# Patient Record
Sex: Female | Born: 1980 | Race: Black or African American | Hispanic: No
Health system: Southern US, Community
[De-identification: ages and names within clinical notes are randomized; demographics above are authoritative.]

## PROBLEM LIST (undated history)

## (undated) DIAGNOSIS — J45909 Unspecified asthma, uncomplicated: Secondary | ICD-10-CM

## (undated) DIAGNOSIS — Z98891 History of uterine scar from previous surgery: Secondary | ICD-10-CM

## (undated) DIAGNOSIS — R519 Headache, unspecified: Secondary | ICD-10-CM

## (undated) HISTORY — PX: TUBAL LIGATION: SHX77

---

## 2017-02-27 DIAGNOSIS — D259 Leiomyoma of uterus, unspecified: Secondary | ICD-10-CM | POA: Insufficient documentation

## 2017-02-27 DIAGNOSIS — K529 Noninfective gastroenteritis and colitis, unspecified: Secondary | ICD-10-CM | POA: Insufficient documentation

## 2017-02-27 DIAGNOSIS — F17200 Nicotine dependence, unspecified, uncomplicated: Secondary | ICD-10-CM | POA: Insufficient documentation

## 2017-02-27 LAB — CBC
HEMATOCRIT: 38.8 % (ref 35.0–47.0)
HEMOGLOBIN: 13.1 g/dL (ref 12.0–16.0)
MCH: 30.5 pg (ref 26.0–34.0)
MCHC: 33.8 g/dL (ref 32.0–36.0)
MCV: 90.4 fL (ref 80.0–100.0)
Platelets: 199 10*3/uL (ref 150–440)
RBC: 4.3 MIL/uL (ref 3.80–5.20)
RDW: 13.6 % (ref 11.5–14.5)
WBC: 7.7 10*3/uL (ref 3.6–11.0)

## 2017-02-27 LAB — COMPREHENSIVE METABOLIC PANEL
ALT: 16 U/L (ref 14–54)
ANION GAP: 10 (ref 5–15)
AST: 24 U/L (ref 15–41)
Albumin: 4.5 g/dL (ref 3.5–5.0)
Alkaline Phosphatase: 55 U/L (ref 38–126)
BILIRUBIN TOTAL: 0.9 mg/dL (ref 0.3–1.2)
BUN: 11 mg/dL (ref 6–20)
CHLORIDE: 106 mmol/L (ref 101–111)
CO2: 23 mmol/L (ref 22–32)
Calcium: 8.9 mg/dL (ref 8.9–10.3)
Creatinine, Ser: 0.7 mg/dL (ref 0.44–1.00)
GLUCOSE: 110 mg/dL — AB (ref 65–99)
POTASSIUM: 3.6 mmol/L (ref 3.5–5.1)
Sodium: 139 mmol/L (ref 135–145)
TOTAL PROTEIN: 7.6 g/dL (ref 6.5–8.1)

## 2017-02-27 LAB — LIPASE, BLOOD: Lipase: 18 U/L (ref 11–51)

## 2017-02-27 MED ORDER — ONDANSETRON 4 MG PO TBDP
4.0000 mg | ORAL_TABLET | Freq: Once | ORAL | Status: AC
  Administered 2017-02-27: 4 mg via ORAL

## 2017-02-27 MED ORDER — ONDANSETRON 4 MG PO TBDP
ORAL_TABLET | ORAL | Status: AC
  Filled 2017-02-27: qty 1

## 2017-02-27 NOTE — ED Triage Notes (Addendum)
Pt presents via [facility] with c/o of RLQ pain that started today. Pt states N/V. Pt currently vomiting in triage.

## 2017-02-27 NOTE — ED Notes (Signed)
Pt given specimen cup to obtain urine when able to.

## 2017-02-28 ENCOUNTER — Emergency Department
Admission: EM | Admit: 2017-02-28 | Discharge: 2017-02-28 | Disposition: A | Discharge status: Home / Self Care | Payer: Self-pay | Attending: Emergency Medicine | Admitting: Emergency Medicine

## 2017-02-28 ENCOUNTER — Emergency Department: Payer: Self-pay

## 2017-02-28 DIAGNOSIS — D259 Leiomyoma of uterus, unspecified: Secondary | ICD-10-CM

## 2017-02-28 DIAGNOSIS — R102 Pelvic and perineal pain: Secondary | ICD-10-CM

## 2017-02-28 DIAGNOSIS — K529 Noninfective gastroenteritis and colitis, unspecified: Secondary | ICD-10-CM

## 2017-02-28 HISTORY — DX: Unspecified asthma, uncomplicated: J45.909

## 2017-02-28 LAB — URINALYSIS, COMPLETE (UACMP) WITH MICROSCOPIC
BILIRUBIN URINE: NEGATIVE
GLUCOSE, UA: NEGATIVE mg/dL
KETONES UR: 20 mg/dL — AB
NITRITE: NEGATIVE
PH: 5 (ref 5.0–8.0)
PROTEIN: 30 mg/dL — AB
Specific Gravity, Urine: 1.031 — ABNORMAL HIGH (ref 1.005–1.030)

## 2017-02-28 LAB — POCT PREGNANCY, URINE: Preg Test, Ur: NEGATIVE

## 2017-02-28 MED ORDER — IOPAMIDOL (ISOVUE-300) INJECTION 61%
30.0000 mL | Freq: Once | INTRAVENOUS | Status: AC | PRN
  Administered 2017-02-28: 30 mL via ORAL

## 2017-02-28 MED ORDER — KETOROLAC TROMETHAMINE 30 MG/ML IJ SOLN
30.0000 mg | Freq: Once | INTRAMUSCULAR | Status: AC
Start: 2017-02-28 — End: 2017-02-28
  Administered 2017-02-28: 30 mg via INTRAVENOUS
  Filled 2017-02-28: qty 1

## 2017-02-28 MED ORDER — IOPAMIDOL (ISOVUE-300) INJECTION 61%
100.0000 mL | Freq: Once | INTRAVENOUS | Status: AC | PRN
  Administered 2017-02-28: 100 mL via INTRAVENOUS

## 2017-02-28 MED ORDER — ONDANSETRON HCL 4 MG/2ML IJ SOLN
4.0000 mg | Freq: Once | INTRAMUSCULAR | Status: AC
  Administered 2017-02-28: 4 mg via INTRAVENOUS
  Filled 2017-02-28: qty 2

## 2017-02-28 MED ORDER — ONDANSETRON 4 MG PO TBDP: 4.0000 mg | ORAL_TABLET | Freq: Three times a day (TID) | ORAL | 0 refills | Status: DC | PRN

## 2017-02-28 NOTE — ED Provider Notes (Signed)
[facility] Emergency Department Provider Note    First MD Initiated Contact with Patient 02/28/17 0120     (approximate)  I have reviewed the triage vital signs and the nursing notes.   HISTORY  Chief Complaint Abdominal Pain   HPI Jamie Bishop is a 36 y.o. female presents with acute onset of left lower quadrant abdominal pain yesterday evening after work. Patient states her current pain score is 10 out of 10 and described as sharp. Patient admits to vomiting no diarrhea. Patient denies any fever. Patient denies any urinary symptoms no dysuria or frequency urgency or hematuria.  Past medical history None There are no active problems to display for this patient.   Past surgical history None  Prior to Admission medications   Medication Sig Start Date End Date Taking? Authorizing Provider  ondansetron (ZOFRAN ODT) 4 MG disintegrating tablet Take 1 tablet (4 mg total) by mouth every 8 (eight) hours as needed for nausea or vomiting. 02/28/17   Gregor Hams, MD    Allergies No known drug allergies  Family history Noncontributory  Social History Social History  Substance Use Topics  . Smoking status: Current Every Day Smoker  . Smokeless tobacco: Never Used  . Alcohol use Not on file    Review of Systems Constitutional: No fever/chills Eyes: No visual changes. ENT: No sore throat. Cardiovascular: Denies chest pain. Respiratory: Denies shortness of breath. Gastrointestinal: Positive for abdominal pain and vomiting. Genitourinary: Negative for dysuria. Musculoskeletal: Negative for neck pain.  Negative for back pain. Integumentary: Negative for rash. Neurological: Negative for headaches, focal weakness or numbness.   ____________________________________________   PHYSICAL EXAM:  VITAL SIGNS: ED Triage Vitals  Enc Vitals Group     BP 02/27/17 2216 134/80     Pulse Rate 02/27/17 2216 67     Resp 02/27/17 2216 18     Temp  02/27/17 2216 97.8 F (36.6 C)     Temp Source 02/27/17 2216 Oral     SpO2 02/27/17 2216 100 %     Weight 02/27/17 2213 150 lb (68 kg)     Height 02/27/17 2213 4\' 11"  (1.499 m)     Head Circumference --      Peak Flow --      Pain Score 02/27/17 2212 10     Pain Loc --      Pain Edu? --      Excl. in [place]? --     Constitutional: Alert and oriented. Well appearing and in no acute distress. Eyes: Conjunctivae are normal.  Head: Atraumatic. Mouth/Throat: Mucous membranes are moist. Oropharynx non-erythematous. Neck: No stridor.   Cardiovascular: Normal rate, regular rhythm. Good peripheral circulation. Grossly normal heart sounds. Respiratory: Normal respiratory effort.  No retractions. Lungs CTAB. Gastrointestinal: Soft and nontender. No distention.  Musculoskeletal: No lower extremity tenderness nor edema. No gross deformities of extremities. Neurologic:  Normal speech and language. No gross focal neurologic deficits are appreciated.  Skin:  Skin is warm, dry and intact. No rash noted. Psychiatric: Mood and affect are normal. Speech and behavior are normal.  ____________________________________________   LABS (all labs ordered are listed, but only abnormal results are displayed)  Labs Reviewed  COMPREHENSIVE METABOLIC PANEL - Abnormal; Notable for the following:       Result Value   Glucose, Bld 110 (*)    All other components within normal limits  URINALYSIS, COMPLETE (UACMP) WITH MICROSCOPIC - Abnormal; Notable for the following:    Color, Urine AMBER (*)  APPearance CLOUDY (*)    Specific Gravity, Urine 1.031 (*)    Hgb urine dipstick SMALL (*)    Ketones, ur 20 (*)    Protein, ur 30 (*)    Leukocytes, UA MODERATE (*)    Bacteria, UA RARE (*)    Squamous Epithelial / LPF 6-30 (*)    All other components within normal limits  LIPASE, BLOOD  CBC  POCT PREGNANCY, URINE    RADIOLOGY I, [place], personally viewed and evaluated these images (plain  radiographs) as part of my medical decision making, as well as reviewing the written report by the radiologist.  US Transvaginal Non-ob  Result Date: 02/28/2017 CLINICAL DATA:  36 year old G2 P2 female with LMP 02/11/2017 presenting with right lower quadrant pain and abnormal appearance of the uterus on CT. EXAM: TRANSABDOMINAL AND TRANSVAGINAL ULTRASOUND OF PELVIS TECHNIQUE: Both transabdominal and transvaginal ultrasound examinations of the pelvis were performed. Transabdominal technique was performed for global imaging of the pelvis including uterus, ovaries, adnexal regions, and pelvic cul-de-sac. It was necessary to proceed with endovaginal exam following the transabdominal exam to visualize the uterus, endometrium and ovaries. COMPARISON:  CT abdomen pelvis 02/28/2017 FINDINGS: Uterus Measurements: 8.1 x 5.3 x 5.8 cm. There is a large intramural mass exerting mass effect on the endometrial cavity. The mass measures 4.4 x 3.5 x 3.7 cm and is located at the left aspect of the fundus. There is an internal fluid level. There is no internal vascularity. Endometrium Thickness: 5.6 mm.  No focal abnormality visualized. Right ovary Measurements: 3.2 x 1.7 x 2.2 cm. Normal appearance/no adnexal mass. Left ovary Measurements: 3.0 x 1.4 x 2.2 cm. Normal appearance/no adnexal mass. Other findings No abnormal free fluid. IMPRESSION: 1. Large intramural mass distorting the adjacent endometrial cavity with internal fluid level is most suggestive of a degenerating uterine fibroid. 2. No ovarian or adnexal abnormality. Electronically Signed   By: Ulyses Jarred M.D.   On: 02/28/2017 05:25   US Pelvis Complete  Result Date: 02/28/2017 CLINICAL DATA:  36 year old G59 P2 female with LMP 02/11/2017 presenting with right lower quadrant pain and abnormal appearance of the uterus on CT. EXAM: TRANSABDOMINAL AND TRANSVAGINAL ULTRASOUND OF PELVIS TECHNIQUE: Both transabdominal and transvaginal ultrasound examinations of the  pelvis were performed. Transabdominal technique was performed for global imaging of the pelvis including uterus, ovaries, adnexal regions, and pelvic cul-de-sac. It was necessary to proceed with endovaginal exam following the transabdominal exam to visualize the uterus, endometrium and ovaries. COMPARISON:  CT abdomen pelvis 02/28/2017 FINDINGS: Uterus Measurements: 8.1 x 5.3 x 5.8 cm. There is a large intramural mass exerting mass effect on the endometrial cavity. The mass measures 4.4 x 3.5 x 3.7 cm and is located at the left aspect of the fundus. There is an internal fluid level. There is no internal vascularity. Endometrium Thickness: 5.6 mm.  No focal abnormality visualized. Right ovary Measurements: 3.2 x 1.7 x 2.2 cm. Normal appearance/no adnexal mass. Left ovary Measurements: 3.0 x 1.4 x 2.2 cm. Normal appearance/no adnexal mass. Other findings No abnormal free fluid. IMPRESSION: 1. Large intramural mass distorting the adjacent endometrial cavity with internal fluid level is most suggestive of a degenerating uterine fibroid. 2. No ovarian or adnexal abnormality. Electronically Signed   By: Ulyses Jarred M.D.   On: 02/28/2017 05:25   Ct Abdomen Pelvis W Contrast  Result Date: 02/28/2017 CLINICAL DATA:  Right lower quadrant pain starting today with nausea and vomiting. History of partial hysterectomy. Negative pregnancy test. EXAM:  CT ABDOMEN AND PELVIS WITH CONTRAST TECHNIQUE: Multidetector CT imaging of the abdomen and pelvis was performed using the standard protocol following bolus administration of intravenous contrast. CONTRAST:  1107mL ISOVUE-300 IOPAMIDOL (ISOVUE-300) INJECTION 61% COMPARISON:  None. FINDINGS: Lower chest: Normal size cardiac chambers.  Clear bases. Hepatobiliary: There is a small ventral hernia overlying the left hepatic lobe possibly containing a small portion of the umbilical vein, series 2, image 26. The liver enhances homogeneously without space-occupying mass. The gallbladder  is contracted. There may be a tiny calculus along the dependent wall, series 2, image 34. No secondary signs of acute cholecystitis. Pancreas: Unremarkable. No pancreatic ductal dilatation or surrounding inflammatory changes. Spleen: Normal in size without focal abnormality. Adrenals/Urinary Tract: Adrenal glands are unremarkable. Kidneys are normal, without renal calculi, focal lesion, or hydronephrosis. Bladder is unremarkable. Stomach/Bowel: The stomach is nondistended. There is normal small bowel rotation. Mild fluid-filled distention and thickening of the duodenum and proximal jejunum may represent an enteritis. No definite evidence of appendicitis. There is no large bowel dilatation. Somewhat thickened appearance of the rectosigmoid may be due to underdistention. A mild proctocolitis is not excluded but believed less likely. Vascular/Lymphatic: No significant vascular findings are present. No enlarged abdominal or pelvic lymph nodes. Reproductive: There is an approximately 3.9 x 3.3 x 3.6 cm well-circumscribed intramural hypodensity associated with the uterus possibly representing a degenerating fibroid. It appears to be adjacent to the endometrial cavity which is to the right of midline. Normal physiologic sized follicles are noted of the left ovary. No adnexal mass is seen. Other: Small fat containing umbilical hernia. No abdominopelvic ascites. Musculoskeletal: No acute or significant osseous findings. IMPRESSION: 1. There is a well circumscribed intramural water attenuating hypodensity associated with the uterus measuring 3.9 x 3.3 x 3.6 cm possibly representing a degenerating uterine fibroid. It appears to be separate from the endometrial cavity which is seen to the right of this abnormality. Ultrasound or pelvic MRI may help for further correlation. Has this patient undergone uterine fibroid embolization that might explain this finding? 2. Mild thickening of proximal small bowel question enteritis.  Slight segmental thickening of the rectosigmoid may represent underdistention. The possibility of a proctocolitis is not entirely excluded but believed less likely in the absence of clinical signs. 3. Umbilical and upper abdominal ventral hernias as above. Electronically Signed   By: Ashley Royalty M.D.   On: 02/28/2017 03:05      Procedures   ____________________________________________   INITIAL IMPRESSION / ASSESSMENT AND PLAN / ED COURSE  Pertinent labs & imaging results that were available during my care of the patient were reviewed by me and considered in my medical decision making (see chart for details).  Patient given Toradol and Zofran in the emergency department with no further vomiting. Abdominal pain improved.      ____________________________________________  FINAL CLINICAL IMPRESSION(S) / ED DIAGNOSES  Final diagnoses:  Enteritis  Uterine leiomyoma, unspecified location     MEDICATIONS GIVEN DURING THIS VISIT:  Medications  ondansetron (ZOFRAN-ODT) 4 MG disintegrating tablet (not administered)  ondansetron (ZOFRAN-ODT) disintegrating tablet 4 mg (4 mg Oral Given 02/27/17 2219)  ketorolac (TORADOL) 30 MG/ML injection 30 mg (30 mg Intravenous Given 02/28/17 0138)  ondansetron (ZOFRAN) injection 4 mg (4 mg Intravenous Given 02/28/17 0138)  iopamidol (ISOVUE-300) 61 % injection 30 mL (30 mLs Oral Contrast Given 02/28/17 0136)  iopamidol (ISOVUE-300) 61 % injection 100 mL (100 mLs Intravenous Contrast Given 02/28/17 0226)     NEW OUTPATIENT MEDICATIONS STARTED DURING THIS  VISIT:  Discharge Medication List as of 02/28/2017  6:12 AM    START taking these medications   Details  ondansetron (ZOFRAN ODT) 4 MG disintegrating tablet Take 1 tablet (4 mg total) by mouth every 8 (eight) hours as needed for nausea or vomiting., Starting Thu 02/28/2017, Print        Discharge Medication List as of 02/28/2017  6:12 AM      Discharge Medication List as of 02/28/2017  6:12 AM        Note:  This document was prepared using Dragon voice recognition software and may include unintentional dictation errors.    Gregor Hams, MD 02/28/17 [phone]

## 2017-02-28 NOTE — ED Notes (Signed)
Patient transported to Ultrasound 

## 2017-09-02 LAB — HM HIV SCREENING LAB: HM HIV Screening: NEGATIVE

## 2017-11-19 ENCOUNTER — Emergency Department
Admission: EM | Admit: 2017-11-19 | Discharge: 2017-11-19 | Disposition: A | Discharge status: Home / Self Care | Payer: Self-pay | Attending: Emergency Medicine | Admitting: Emergency Medicine

## 2017-11-19 ENCOUNTER — Other Ambulatory Visit: Payer: Self-pay

## 2017-11-19 DIAGNOSIS — J45909 Unspecified asthma, uncomplicated: Secondary | ICD-10-CM | POA: Insufficient documentation

## 2017-11-19 DIAGNOSIS — H1032 Unspecified acute conjunctivitis, left eye: Secondary | ICD-10-CM | POA: Insufficient documentation

## 2017-11-19 DIAGNOSIS — F17200 Nicotine dependence, unspecified, uncomplicated: Secondary | ICD-10-CM | POA: Insufficient documentation

## 2017-11-19 MED ORDER — SULFACETAMIDE SODIUM 10 % OP SOLN: 2.0000 [drp] | OPHTHALMIC | 0 refills | Status: AC

## 2017-11-19 NOTE — ED Provider Notes (Signed)
[facility] Emergency Department Provider Note ____________________________________________  Time seen: Approximately 3:45 PM  I have reviewed the triage vital signs and the nursing notes.   HISTORY  Chief Complaint Eye Pain   HPI Ariannah Arenson is a 37 y.o. female *Who presents to the emergency department for treatment and evaluation of redness and swelling to the left eye pain and irritation. Symptoms started yesterday. Upon awakening this morning, her eye was matted. She has used multiple over-the-counter eye drops, has applied a warm compress, and has taken allergy medication without relief. She denies vision changes, but complains of photophobia, pain, and drainage.   Past Medical History:  Diagnosis Date  . Asthma     There are no active problems to display for this patient.   No past surgical history on file.  Prior to Admission medications   Medication Sig Start Date End Date Taking? Authorizing Provider  ondansetron (ZOFRAN ODT) 4 MG disintegrating tablet Take 1 tablet (4 mg total) by mouth every 8 (eight) hours as needed for nausea or vomiting. 02/28/17   Gregor Hams, MD  sulfacetamide (BLEPH-10) 10 % ophthalmic solution Place 2 drops into the left eye every 2 (two) hours while awake for 2 days. After 2 days, place 2 drops into the left eye every 4 hours while awake. 11/19/17 11/21/17  Victorino Dike, FNP    Allergies Patient has no known allergies.  No family history on file.  Social History Social History   Tobacco Use  . Smoking status: Current Every Day Smoker  . Smokeless tobacco: Never Used  Substance Use Topics  . Alcohol use: Not on file  . Drug use: Not on file    Review of Systems   Constitutional: No fever/chills Eyes: Negative for visual changes. Positive for pain. Musculoskeletal: Negative for pain. Skin: Negative for rash. Neurological: Negative for headaches, focal weakness or numbness. Allergic: Positive for  seasonal allergies. ____________________________________________  PHYSICAL EXAM:  VITAL SIGNS: ED Triage Vitals  Enc Vitals Group     BP 11/19/17 1330 140/79     Pulse Rate 11/19/17 1330 90     Resp 11/19/17 1330 20     Temp 11/19/17 1330 98.3 F (36.8 C)     Temp Source 11/19/17 1330 Oral     SpO2 11/19/17 1330 99 %     Weight 11/19/17 1331 150 lb (68 kg)     Height 11/19/17 1331 4\' 11"  (1.499 m)     Head Circumference --      Peak Flow --      Pain Score --      Pain Loc --      Pain Edu? --      Excl. in [place]? --     Constitutional: Alert and oriented. Well appearing and in no acute distress. Eyes: Visual acuity--see nursing documentation; no globe trauma; Eyelids normal to inspection; Sclera appears anicteric.  Eyelids not inverted. Conjunctiva appears erythematous to the limbus; Cornea clear on unstained exam. Head: Atraumatic. Nose: No congestion/rhinnorhea. Mouth/Throat: Mucous membranes are moist.  Oropharynx non-erythematous. Respiratory: Breath sounds clear to auscultation. Musculoskeletal:Normal ROM x 4 extremities. Neurologic:  Normal speech and language. No gross focal neurologic deficits are appreciated. Speech is normal. No gait instability. Skin:  Skin is warm, dry and intact. No rash noted. Psychiatric: Mood and affect are normal. Speech and behavior are normal.  ____________________________________________   LABS (all labs ordered are listed, but only abnormal results are displayed)  Labs Reviewed - No  data to display ____________________________________________  EKG  n/a ____________________________________________  RADIOLOGY  n/a ____________________________________________   PROCEDURES  Procedure(s) performed: none ____________________________________________   INITIAL IMPRESSION / ASSESSMENT AND PLAN / ED COURSE  37 year old female presenting to the emergency department for treatment and evaluation of left irritation, drainage,  photophobia, and erythema symptoms and exam most consistent with a bacterial conjunctivitis for which she will be treated with Bleph-10.Marland Kitchen  Hand hygiene was strongly encouraged.  She was provided a work note for today as well.  She is to follow-up with the ophthalmologist for symptoms that are not improving over the next 2-3 days or return to the emergency department if she is unable to schedule an appointment.  Pertinent labs & imaging results that were available during my care of the patient were reviewed by me and considered in my medical decision making (see chart for details). ____________________________________________   FINAL CLINICAL IMPRESSION(S) / ED DIAGNOSES  Final diagnoses:  Acute bacterial conjunctivitis of left eye    Note:  This document was prepared using Dragon voice recognition software and may include unintentional dictation errors.    Victorino Dike, FNP 11/19/17 [place], [place], MD 11/20/17 [phone]

## 2017-11-19 NOTE — ED Notes (Signed)
Redness and swelling to left eye with pain and irritation.

## 2018-05-25 ENCOUNTER — Encounter: Payer: Self-pay | Admitting: Emergency Medicine

## 2018-05-25 ENCOUNTER — Emergency Department
Admission: EM | Admit: 2018-05-25 | Discharge: 2018-05-25 | Disposition: A | Discharge status: Home / Self Care | Payer: Self-pay | Attending: Emergency Medicine | Admitting: Emergency Medicine

## 2018-05-25 ENCOUNTER — Other Ambulatory Visit: Payer: Self-pay

## 2018-05-25 ENCOUNTER — Emergency Department: Payer: Self-pay

## 2018-05-25 DIAGNOSIS — S0292XA Unspecified fracture of facial bones, initial encounter for closed fracture: Secondary | ICD-10-CM | POA: Insufficient documentation

## 2018-05-25 DIAGNOSIS — F17210 Nicotine dependence, cigarettes, uncomplicated: Secondary | ICD-10-CM | POA: Insufficient documentation

## 2018-05-25 DIAGNOSIS — Y999 Unspecified external cause status: Secondary | ICD-10-CM | POA: Insufficient documentation

## 2018-05-25 DIAGNOSIS — Y92009 Unspecified place in unspecified non-institutional (private) residence as the place of occurrence of the external cause: Secondary | ICD-10-CM | POA: Insufficient documentation

## 2018-05-25 DIAGNOSIS — J45909 Unspecified asthma, uncomplicated: Secondary | ICD-10-CM | POA: Insufficient documentation

## 2018-05-25 DIAGNOSIS — Z23 Encounter for immunization: Secondary | ICD-10-CM | POA: Insufficient documentation

## 2018-05-25 DIAGNOSIS — S0011XA Contusion of right eyelid and periocular area, initial encounter: Secondary | ICD-10-CM | POA: Insufficient documentation

## 2018-05-25 DIAGNOSIS — Y9389 Activity, other specified: Secondary | ICD-10-CM | POA: Insufficient documentation

## 2018-05-25 DIAGNOSIS — H05231 Hemorrhage of right orbit: Secondary | ICD-10-CM

## 2018-05-25 DIAGNOSIS — S41111A Laceration without foreign body of right upper arm, initial encounter: Secondary | ICD-10-CM | POA: Insufficient documentation

## 2018-05-25 MED ORDER — TETANUS-DIPHTH-ACELL PERTUSSIS 5-2.5-18.5 LF-MCG/0.5 IM SUSP
0.5000 mL | Freq: Once | INTRAMUSCULAR | Status: AC
  Administered 2018-05-25: 0.5 mL via INTRAMUSCULAR
  Filled 2018-05-25: qty 0.5

## 2018-05-25 MED ORDER — ACETAMINOPHEN 500 MG PO TABS
1000.0000 mg | ORAL_TABLET | Freq: Once | ORAL | Status: AC
  Administered 2018-05-25: 1000 mg via ORAL
  Filled 2018-05-25: qty 2

## 2018-05-25 MED ORDER — TETRACAINE HCL 0.5 % OP SOLN
1.0000 [drp] | Freq: Once | OPHTHALMIC | Status: DC
  Filled 2018-05-25: qty 4

## 2018-05-25 MED ORDER — LIDOCAINE HCL (PF) 1 % IJ SOLN
INTRAMUSCULAR | Status: AC
  Filled 2018-05-25: qty 5
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
## 2018-05-25 NOTE — ED Notes (Signed)
Dr in with pt repairing laceration.

## 2018-10-11 ENCOUNTER — Other Ambulatory Visit: Payer: Self-pay

## 2018-10-11 ENCOUNTER — Emergency Department
Admission: EM | Admit: 2018-10-11 | Discharge: 2018-10-11 | Disposition: A | Discharge status: Home / Self Care | Payer: Self-pay | Attending: Emergency Medicine | Admitting: Emergency Medicine

## 2018-10-11 ENCOUNTER — Encounter: Payer: Self-pay | Admitting: *Deleted

## 2018-10-11 ENCOUNTER — Emergency Department: Payer: Self-pay

## 2018-10-11 DIAGNOSIS — Y929 Unspecified place or not applicable: Secondary | ICD-10-CM | POA: Insufficient documentation

## 2018-10-11 DIAGNOSIS — J45909 Unspecified asthma, uncomplicated: Secondary | ICD-10-CM | POA: Insufficient documentation

## 2018-10-11 DIAGNOSIS — S0083XA Contusion of other part of head, initial encounter: Secondary | ICD-10-CM | POA: Insufficient documentation

## 2018-10-11 DIAGNOSIS — Y939 Activity, unspecified: Secondary | ICD-10-CM | POA: Insufficient documentation

## 2018-10-11 DIAGNOSIS — F17210 Nicotine dependence, cigarettes, uncomplicated: Secondary | ICD-10-CM | POA: Insufficient documentation

## 2018-10-11 DIAGNOSIS — Y999 Unspecified external cause status: Secondary | ICD-10-CM | POA: Insufficient documentation

## 2018-10-11 MED ORDER — ONDANSETRON 4 MG PO TBDP: 4.0000 mg | ORAL_TABLET | Freq: Three times a day (TID) | ORAL | 0 refills | Status: DC | PRN

## 2018-10-11 MED ORDER — HYDROCODONE-ACETAMINOPHEN 5-325 MG PO TABS
1.0000 | ORAL_TABLET | Freq: Once | ORAL | Status: AC
  Administered 2018-10-11: 1 via ORAL
  Filled 2018-10-11: qty 1

## 2018-10-11 MED ORDER — IBUPROFEN 600 MG PO TABS: 600.0000 mg | ORAL_TABLET | Freq: Three times a day (TID) | ORAL | 0 refills | Status: DC | PRN

## 2018-10-11 MED ORDER — HYDROCODONE-ACETAMINOPHEN 5-325 MG PO TABS: 1.0000 | ORAL_TABLET | Freq: Four times a day (QID) | ORAL | 0 refills | Status: DC | PRN

## 2018-10-11 MED ORDER — ONDANSETRON 4 MG PO TBDP
4.0000 mg | ORAL_TABLET | Freq: Once | ORAL | Status: AC
  Administered 2018-10-11: 4 mg via ORAL
  Filled 2018-10-11: qty 1
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
## 2018-10-11 NOTE — Discharge Instructions (Addendum)
1.  Apply ice to affected area several times daily to reduce swelling. 2.  You may take medicines as needed for pain and nausea (Motrin/Norco/Zofran #15). 3.  Return to the ER for worsening symptoms, persistent vomiting, lethargy or other concerns.

## 2019-03-10 ENCOUNTER — Emergency Department
Admission: EM | Admit: 2019-03-10 | Discharge: 2019-03-10 | Disposition: A | Discharge status: Home / Self Care | Payer: Self-pay | Attending: Emergency Medicine | Admitting: Emergency Medicine

## 2019-03-10 ENCOUNTER — Encounter: Payer: Self-pay | Admitting: Emergency Medicine

## 2019-03-10 DIAGNOSIS — F17210 Nicotine dependence, cigarettes, uncomplicated: Secondary | ICD-10-CM | POA: Insufficient documentation

## 2019-03-10 DIAGNOSIS — J45909 Unspecified asthma, uncomplicated: Secondary | ICD-10-CM | POA: Insufficient documentation

## 2019-03-10 DIAGNOSIS — R109 Unspecified abdominal pain: Secondary | ICD-10-CM

## 2019-03-10 DIAGNOSIS — R112 Nausea with vomiting, unspecified: Secondary | ICD-10-CM | POA: Insufficient documentation

## 2019-03-10 DIAGNOSIS — R1032 Left lower quadrant pain: Secondary | ICD-10-CM | POA: Insufficient documentation

## 2019-03-10 LAB — CBC
HCT: 37.6 % (ref 36.0–46.0)
Hemoglobin: 12.4 g/dL (ref 12.0–15.0)
MCH: 30.8 pg (ref 26.0–34.0)
MCHC: 33 g/dL (ref 30.0–36.0)
MCV: 93.3 fL (ref 80.0–100.0)
Platelets: 240 10*3/uL (ref 150–400)
RBC: 4.03 MIL/uL (ref 3.87–5.11)
RDW: 12.7 % (ref 11.5–15.5)
WBC: 8 10*3/uL (ref 4.0–10.5)
nRBC: 0 % (ref 0.0–0.2)

## 2019-03-10 LAB — COMPREHENSIVE METABOLIC PANEL
ALT: 11 U/L (ref 0–44)
AST: 17 U/L (ref 15–41)
Albumin: 4.5 g/dL (ref 3.5–5.0)
Alkaline Phosphatase: 55 U/L (ref 38–126)
Anion gap: 10 (ref 5–15)
BUN: 11 mg/dL (ref 6–20)
CO2: 26 mmol/L (ref 22–32)
Calcium: 8.7 mg/dL — ABNORMAL LOW (ref 8.9–10.3)
Chloride: 104 mmol/L (ref 98–111)
Creatinine, Ser: 0.64 mg/dL (ref 0.44–1.00)
GFR calc Af Amer: >60 mL/min (ref >60)
GFR calc non Af Amer: >60 mL/min (ref >60)
Glucose, Bld: 102 mg/dL — ABNORMAL HIGH (ref 70–99)
Potassium: 3.1 mmol/L — ABNORMAL LOW (ref 3.5–5.1)
Sodium: 140 mmol/L (ref 135–145)
Total Bilirubin: 0.3 mg/dL (ref 0.3–1.2)
Total Protein: 7.5 g/dL (ref 6.5–8.1)

## 2019-03-10 LAB — PREGNANCY, URINE: Preg Test, Ur: NEGATIVE

## 2019-03-10 LAB — URINALYSIS, COMPLETE (UACMP) WITH MICROSCOPIC
Bilirubin Urine: NEGATIVE
Glucose, UA: NEGATIVE mg/dL
Hgb urine dipstick: NEGATIVE
Ketones, ur: NEGATIVE mg/dL
Nitrite: NEGATIVE
Protein, ur: NEGATIVE mg/dL
Specific Gravity, Urine: 1.012 (ref 1.005–1.030)
pH: 7 (ref 5.0–8.0)

## 2019-03-10 LAB — LIPASE, BLOOD: Lipase: 28 U/L (ref 11–51)

## 2019-03-10 MED ORDER — SODIUM CHLORIDE 0.9% FLUSH: 3.0000 mL | Freq: Once | INTRAVENOUS | Status: DC

## 2019-03-10 MED ORDER — DICYCLOMINE HCL 20 MG PO TABS: 20.0000 mg | ORAL_TABLET | Freq: Three times a day (TID) | ORAL | 0 refills | Status: DC | PRN

## 2019-03-10 MED ORDER — DICYCLOMINE HCL 10 MG PO CAPS
20.0000 mg | ORAL_CAPSULE | Freq: Once | ORAL | Status: AC
  Administered 2019-03-10: 20 mg via ORAL
  Filled 2019-03-10: qty 2

## 2019-03-10 NOTE — ED Notes (Signed)
Pt states shes been having this abd pain on and off for 1 year. Pt states pain is worse today. Pt denies diarrhea states she has had some N/V

## 2019-03-10 NOTE — ED Provider Notes (Signed)
[facility] Emergency Department Provider Note  ____________________________________________   I have reviewed the triage vital signs and the nursing notes.   HISTORY  Chief Complaint Abdominal Pain   History limited by: Not Limited   HPI Jamie Bishop is a 38 y.o. female who presents to the emergency department today because of concerns for abdominal pain.  Is located in the left lower quadrant.  She states it started after she got off work at 4 PM this afternoon.  She states it is severe and worse with movement.  It has been accompanied by some nausea and vomiting.  States she vomited 3 times.  She says that she has had similar pain quite often.  It is now become a daily occurrence.  She states it has been going on for a long time and she has been evaluated for it in the past.  She states she has had a negative CT scan in the past for the same pain.  Records reviewed. Per medical record review patient has a history of asthma  Past Medical History:  Diagnosis Date  . Asthma     There are no active problems to display for this patient.   History reviewed. No pertinent surgical history.  Prior to Admission medications   Medication Sig Start Date End Date Taking? Authorizing Provider  HYDROcodone-acetaminophen (NORCO) 5-325 MG tablet Take 1 tablet by mouth every 6 (six) hours as needed for moderate pain. 10/11/18   Paulette Blanch, MD  ibuprofen (ADVIL,MOTRIN) 600 MG tablet Take 1 tablet (600 mg total) by mouth every 8 (eight) hours as needed. 10/11/18   Paulette Blanch, MD  ondansetron (ZOFRAN ODT) 4 MG disintegrating tablet Take 1 tablet (4 mg total) by mouth every 8 (eight) hours as needed for nausea or vomiting. 10/11/18   Paulette Blanch, MD    Allergies Patient has no known allergies.  History reviewed. No pertinent family history.  Social History Social History   Tobacco Use  . Smoking status: Current Every Day Smoker    Packs/day: 0.50    Types:  Cigarettes  . Smokeless tobacco: Never Used  Substance Use Topics  . Alcohol use: Yes    Comment: weekends  . Drug use: Never    Review of Systems Constitutional: No fever/chills Eyes: No visual changes. ENT: No sore throat. Cardiovascular: Denies chest pain. Respiratory: Denies shortness of breath. Gastrointestinal: Positive for left lower quadrant pain and nausea vomiting Genitourinary: Negative for dysuria. Musculoskeletal: Negative for back pain. Skin: Negative for rash. Neurological: Negative for headaches, focal weakness or numbness.  ____________________________________________   PHYSICAL EXAM:  VITAL SIGNS: ED Triage Vitals  Enc Vitals Group     BP 03/10/19 0112 (!) 155/93     Pulse Rate 03/10/19 0112 72     Resp 03/10/19 0112 18     Temp 03/10/19 0112 98 F (36.7 C)     Temp Source 03/10/19 0112 Oral     SpO2 03/10/19 0112 98 %     Weight --      Height --      Head Circumference --      Peak Flow --      Pain Score 03/10/19 0255 10   Constitutional: Alert and oriented.  Eyes: Conjunctivae are normal.  ENT      Head: Normocephalic and atraumatic.      Nose: No congestion/rhinnorhea.      Mouth/Throat: Mucous membranes are moist.      Neck: No stridor.  Hematological/Lymphatic/Immunilogical: No cervical lymphadenopathy. Cardiovascular: Normal rate, regular rhythm.  No murmurs, rubs, or gallops.  Respiratory: Normal respiratory effort without tachypnea nor retractions. Breath sounds are clear and equal bilaterally. No wheezes/rales/rhonchi. Gastrointestinal: Soft and tender in the left lower quadrant. Genitourinary: Deferred Musculoskeletal: Normal range of motion in all extremities. No lower extremity edema. Neurologic:  Normal speech and language. No gross focal neurologic deficits are appreciated.  Skin:  Skin is warm, dry and intact. No rash noted. Psychiatric: Mood and affect are normal. Speech and behavior are normal. Patient exhibits appropriate  insight and judgment.  ____________________________________________    LABS (pertinent positives/negatives)  Upreg negative Lipase 28 CMP wnl except k 3.1, glu 102, ca 8.7 CBC wbc 8.0, hgb 12.4, plt 240 UA turbid, trace leukocytes, 11-20 squamous. 0-5 rbc and wbc  ____________________________________________   EKG  None  ____________________________________________    RADIOLOGY  None  ____________________________________________   PROCEDURES  Procedures  ____________________________________________   INITIAL IMPRESSION / ASSESSMENT AND PLAN / ED COURSE  Pertinent labs & imaging results that were available during my care of the patient were reviewed by me and considered in my medical decision making (see chart for details).   Patient presented to the emergency department today because of concerns for left lower quadrant abdominal pain.  This pain has been present for a long time but occurred again tonight.  On exam she does have some tenderness in the left side.  Patient is afebrile and does not have any leukocytosis.  Urine not consistent with infection or kidney stone.  This point I doubt diverticulitis.  Mesenteric ischemia.  Given that she has had this pain for a long time I doubt significant ovarian pathology.  I doubt torsion.  Patient was given Bentyl and stated that it did help with her pain.  I do wonder if patient has IBS type pathology.  I discussed this with the patient.  Will discharge with prescription for Bentyl and primary care follow-up information.  ____________________________________________   FINAL CLINICAL IMPRESSION(S) / ED DIAGNOSES  Final diagnoses:  Abdominal pain, unspecified abdominal location     Note: This dictation was prepared with Dragon dictation. Any transcriptional errors that result from this process are unintentional     Nance Pear, MD 03/10/19 [phone]

## 2019-03-10 NOTE — ED Triage Notes (Signed)
Pt arrived via EMS from home with c/o left LQ abdominal pain x3 months. Pt reports N/V intermittently. Pt denies urinary symptoms

## 2019-03-10 NOTE — Discharge Instructions (Addendum)
Please seek medical attention for any high fevers, chest pain, shortness of breath, change in behavior, persistent vomiting, bloody stool or any other new or concerning symptoms.  

## 2020-02-09 ENCOUNTER — Emergency Department: Payer: Self-pay

## 2020-02-09 ENCOUNTER — Other Ambulatory Visit: Payer: Self-pay

## 2020-02-09 ENCOUNTER — Emergency Department
Admission: EM | Admit: 2020-02-09 | Discharge: 2020-02-09 | Disposition: A | Discharge status: Home / Self Care | Payer: Self-pay | Attending: Emergency Medicine | Admitting: Emergency Medicine

## 2020-02-09 DIAGNOSIS — N857 Hematometra: Secondary | ICD-10-CM | POA: Insufficient documentation

## 2020-02-09 DIAGNOSIS — F17210 Nicotine dependence, cigarettes, uncomplicated: Secondary | ICD-10-CM | POA: Insufficient documentation

## 2020-02-09 DIAGNOSIS — R1031 Right lower quadrant pain: Secondary | ICD-10-CM | POA: Insufficient documentation

## 2020-02-09 DIAGNOSIS — Z79899 Other long term (current) drug therapy: Secondary | ICD-10-CM | POA: Insufficient documentation

## 2020-02-09 LAB — COMPREHENSIVE METABOLIC PANEL
ALT: 15 U/L (ref 0–44)
AST: 17 U/L (ref 15–41)
Albumin: 4.5 g/dL (ref 3.5–5.0)
Alkaline Phosphatase: 65 U/L (ref 38–126)
Anion gap: 10 (ref 5–15)
BUN: 11 mg/dL (ref 6–20)
CO2: 24 mmol/L (ref 22–32)
Calcium: 9 mg/dL (ref 8.9–10.3)
Chloride: 105 mmol/L (ref 98–111)
Creatinine, Ser: 0.66 mg/dL (ref 0.44–1.00)
GFR calc Af Amer: >60 mL/min (ref >60)
GFR calc non Af Amer: >60 mL/min (ref >60)
Glucose, Bld: 155 mg/dL — ABNORMAL HIGH (ref 70–99)
Potassium: 3.8 mmol/L (ref 3.5–5.1)
Sodium: 139 mmol/L (ref 135–145)
Total Bilirubin: 0.8 mg/dL (ref 0.3–1.2)
Total Protein: 8.2 g/dL — ABNORMAL HIGH (ref 6.5–8.1)

## 2020-02-09 LAB — CBC
HCT: 41.1 % (ref 36.0–46.0)
Hemoglobin: 13.6 g/dL (ref 12.0–15.0)
MCH: 31.7 pg (ref 26.0–34.0)
MCHC: 33.1 g/dL (ref 30.0–36.0)
MCV: 95.8 fL (ref 80.0–100.0)
Platelets: 214 10*3/uL (ref 150–400)
RBC: 4.29 MIL/uL (ref 3.87–5.11)
RDW: 12.5 % (ref 11.5–15.5)
WBC: 8.9 10*3/uL (ref 4.0–10.5)
nRBC: 0 % (ref 0.0–0.2)

## 2020-02-09 LAB — LIPASE, BLOOD: Lipase: 25 U/L (ref 11–51)

## 2020-02-09 MED ORDER — IOHEXOL 350 MG/ML SOLN: 100.0000 mL | Freq: Once | INTRAVENOUS | Status: DC | PRN

## 2020-02-09 MED ORDER — OXYCODONE-ACETAMINOPHEN 5-325 MG PO TABS: 1.0000 | ORAL_TABLET | ORAL | 0 refills | Status: DC | PRN

## 2020-02-09 MED ORDER — ONDANSETRON HCL 4 MG/2ML IJ SOLN
4.0000 mg | Freq: Once | INTRAMUSCULAR | Status: AC
  Administered 2020-02-09: 4 mg via INTRAVENOUS
  Filled 2020-02-09: qty 2

## 2020-02-09 MED ORDER — MORPHINE SULFATE (PF) 4 MG/ML IV SOLN
4.0000 mg | Freq: Once | INTRAVENOUS | Status: AC
  Administered 2020-02-09: 12:00:00 4 mg via INTRAVENOUS
  Filled 2020-02-09: qty 1

## 2020-02-09 MED ORDER — IOHEXOL 300 MG/ML  SOLN
100.0000 mL | Freq: Once | INTRAMUSCULAR | Status: AC | PRN
  Administered 2020-02-09: 100 mL via INTRAVENOUS

## 2020-02-09 MED ORDER — IOHEXOL 9 MG/ML PO SOLN
1000.0000 mL | ORAL | Status: AC
  Administered 2020-02-09: 1000 mL via ORAL

## 2020-02-09 MED ORDER — SODIUM CHLORIDE 0.9 % IV BOLUS
1000.0000 mL | Freq: Once | INTRAVENOUS | Status: AC
  Administered 2020-02-09: 1000 mL via INTRAVENOUS

## 2020-02-09 NOTE — ED Triage Notes (Addendum)
Pt comes EMS from a hotel with right lower abdominal pain and nausea/vomiting. Pt has vomited x2 with yellow appearance. Pt still has appendix and gallbladder. Pt seen in the past before with no answers per pt. Pt VSS. CBG 161 with EMS. AOx4. Denies any drug or alcohol use.

## 2020-02-09 NOTE — ED Provider Notes (Signed)
[facility] Emergency Department Provider Note  Time seen: 11:26 AM  I have reviewed the triage vital signs and the nursing notes.   HISTORY  Chief Complaint Abdominal Pain   HPI Jamie Bishop is a 39 y.o. female with a past medical history of asthma presents to the emergency department for lower abdominal pain.  According to the patient since last night she has been experiencing moderate lower abdominal pain along with nausea vomiting.  Patient states this is happened before and they have not been able to tell her what is causing the pain.  Patient denies any dysuria or hematuria.  Denies any fever cough or shortness of breath.  Patient states she is status post partial hysterectomy.  No vaginal symptoms.   Past Medical History:  Diagnosis Date  . Asthma     There are no problems to display for this patient.   History reviewed. No pertinent surgical history.  Prior to Admission medications   Medication Sig Start Date End Date Taking? Authorizing Provider  dicyclomine (BENTYL) 20 MG tablet Take 1 tablet (20 mg total) by mouth 3 (three) times daily as needed. 03/10/19   Nance Pear, MD  HYDROcodone-acetaminophen (NORCO) 5-325 MG tablet Take 1 tablet by mouth every 6 (six) hours as needed for moderate pain. 10/11/18   Paulette Blanch, MD  ibuprofen (ADVIL,MOTRIN) 600 MG tablet Take 1 tablet (600 mg total) by mouth every 8 (eight) hours as needed. 10/11/18   Paulette Blanch, MD  ondansetron (ZOFRAN ODT) 4 MG disintegrating tablet Take 1 tablet (4 mg total) by mouth every 8 (eight) hours as needed for nausea or vomiting. 10/11/18   Paulette Blanch, MD    No Known Allergies  History reviewed. No pertinent family history.  Social History Social History   Tobacco Use  . Smoking status: Current Every Day Smoker    Packs/day: 0.50    Types: Cigarettes  . Smokeless tobacco: Never Used  Substance Use Topics  . Alcohol use: Yes    Comment: weekends  . Drug use:  Never    Review of Systems Constitutional: Negative for fever. Cardiovascular: Negative for chest pain. Respiratory: Negative for shortness of breath. Gastrointestinal: Moderate lower abdominal pain.  Positive nausea vomiting. Genitourinary: Negative for urinary compaints Musculoskeletal: Negative for musculoskeletal complaints Neurological: Negative for headache All other ROS negative  ____________________________________________   PHYSICAL EXAM:  VITAL SIGNS: ED Triage Vitals  Enc Vitals Group     BP 02/09/20 1053 (!) 176/103     Pulse Rate 02/09/20 1053 79     Resp 02/09/20 1053 18     Temp 02/09/20 1101 97.6 F (36.4 C)     Temp Source 02/09/20 1101 Oral     SpO2 02/09/20 1053 99 %     Weight 02/09/20 1053 149 lb 14.6 oz (68 kg)     Height 02/09/20 1053 4\' 11"  (1.499 m)     Head Circumference --      Peak Flow --      Pain Score 02/09/20 1053 10     Pain Loc --      Pain Edu? --      Excl. in [place]? --     Constitutional: Alert and oriented. Well appearing and in no distress. Eyes: Normal exam ENT      Head: Normocephalic and atraumatic.      Mouth/Throat: Mucous membranes are moist. Cardiovascular: Normal rate, regular rhythm.  Respiratory: Normal respiratory effort without tachypnea nor retractions. Breath  sounds are clear  Gastrointestinal: Soft moderate right lower quadrant abdominal tenderness to palpation.  No rebound guarding or distention.  Abdomen otherwise benign. Musculoskeletal: Nontender with normal range of motion in all extremities.  Neurologic:  Normal speech and language. No gross focal neurologic deficits  Skin:  Skin is warm, dry and intact.  Psychiatric: Mood and affect are normal.   ____________________________________________    EKG  EKG viewed and interpreted by myself shows a normal sinus rhythm at 71 bpm with a narrow QRS, normal axis, normal intervals, no concerning ST changes  ____________________________________________     RADIOLOGY  CT scan shows chronic cholelithiasis mild CBD dilation.  6 cm fluid-filled mass within the uterus.  ____________________________________________   INITIAL IMPRESSION / ASSESSMENT AND PLAN / ED COURSE  Pertinent labs & imaging results that were available during my care of the patient were reviewed by me and considered in my medical decision making (see chart for details).   Patient presents to the emergency department for lower abdominal pain which developed overnight, moderate in severity mostly in the right lower quadrant.  Patient has moderate right lower quadrant tenderness palpation otherwise benign abdomen.  We will check labs, urinalysis, continue to closely monitor.  We will obtain CT imaging to rule out appendicitis, colitis diverticulitis, ureterolithiasis, or other intra-abdominal pathology.  Will obtain urinalysis to rule out UTI or pyelonephritis.  Patient CT scan shows mild CBD dilation with chronic cholelithiasis however LFTs are normal she has no right upper quadrant tenderness.  All of her tenderness is in the right lower abdomen.  Patient does have a hematometra measuring approximately 6 cm.  This is in the area of the patient's discomfort.  I spoke to Dr. Georgianne Fick of GYN, recommends pain control and follow-up in the office for cervical dilation to relieve the fluid.  Discussed this with the patient who is agreeable to plan of care.  Jamie Bishop was evaluated in Emergency Department on 02/09/2020 for the symptoms described in the history of present illness. She was evaluated in the context of the global COVID-19 pandemic, which necessitated consideration that the patient might be at risk for infection with the SARS-CoV-2 virus that causes COVID-19. Institutional protocols and algorithms that pertain to the evaluation of patients at risk for COVID-19 are in a state of rapid change based on information released by regulatory bodies including the CDC and federal and state  organizations. These policies and algorithms were followed during the patient's care in the ED.  ____________________________________________   FINAL CLINICAL IMPRESSION(S) / ED DIAGNOSES  Lower abdominal pain Hematometra   [name], MD 02/09/20 1426

## 2020-02-09 NOTE — Discharge Instructions (Signed)
Please follow-up with Dr. Star Age by calling the number provided to arrange a follow-up appointment as soon as possible.  Please take your pain medication as needed, as prescribed.  Return to the emergency department for any significant worsening of pain, or any other symptom personally concerning to yourself.

## 2020-02-18 ENCOUNTER — Telehealth: Payer: Self-pay | Admitting: Family Medicine

## 2020-02-18 NOTE — Telephone Encounter (Signed)
Please call pt . to discuss birth control options.

## 2020-02-18 NOTE — Telephone Encounter (Addendum)
Phone call to pt. Left message on voicemail that RN with [facility] is calling regarding appt tomorrow, and our provider was reviewing chart and preparing prior to her appt tomorrow. Based on provider notes from April 2021 she would need to see a GYN specialist. We could help her with STD testing if she is interested, but if her appt with [place] tomorrow is the follow-up recommended from April, we would have to refer her to a specialist also and would not be able to follow her for that. Welcome to call us back at [phone]. Antoine Primas, [place] was provider that reviewed chart and recommended follow-up with GYN specialist based on April notes, if that is what pt is coming to [facility] for on 02/19/20.)

## 2020-02-18 NOTE — Telephone Encounter (Deleted)
(  Antoine Primas, PA was provider that reviewed chart and recommended follow-up with GYN specialist based on April notes, if that is what pt is coming to [facility] for on 02/19/20.)

## 2020-02-19 ENCOUNTER — Ambulatory Visit: Payer: Self-pay

## 2020-02-19 NOTE — Telephone Encounter (Signed)
Consulted by RN to review patient chart/results of evaluation from other provider on 02/18/2020.  Per review of same, patient has a mass in uterus that needs further evaluation and follow up by Gyn.  RN to call patient to see if she needs [facility] services and encourage to follow up with GYN specialist.  Reviewed RN note of counseling and agree with documentation.

## 2020-02-19 NOTE — Telephone Encounter (Signed)
Phone call to pt. Pt states she made an appt with [facility] hoping she could be followed for the mass in her uterus.  Pt states in April 2021, Dr. Lennette Bihari gave her a list of GYN specialists she may want to reach out to.  Pt counseled that [facility] would not be the GYN specialist that could follow her for evaluation and/or tx for the uterine mass.  Pt to call [facility] for copy of her records and will contact specialist immediately. Pt confirms no other services are needed by [facility] at this time since we can not follow her for mass, and requests we cancel 02/19/20 [facility] appt.

## 2020-03-01 ENCOUNTER — Encounter: Payer: Self-pay | Admitting: Obstetrics & Gynecology

## 2020-03-07 ENCOUNTER — Other Ambulatory Visit (HOSPITAL_COMMUNITY)
Admission: RE | Admit: 2020-03-07 | Discharge: 2020-03-07 | Disposition: A | Discharge status: Home / Self Care | Payer: Self-pay | Source: Ambulatory Visit | Attending: Obstetrics and Gynecology | Admitting: Obstetrics and Gynecology

## 2020-03-07 ENCOUNTER — Ambulatory Visit (INDEPENDENT_AMBULATORY_CARE_PROVIDER_SITE_OTHER): Payer: Self-pay | Admitting: Obstetrics and Gynecology

## 2020-03-07 ENCOUNTER — Encounter: Payer: Self-pay | Admitting: Obstetrics and Gynecology

## 2020-03-07 ENCOUNTER — Other Ambulatory Visit: Payer: Self-pay

## 2020-03-07 VITALS — BP 130/90 | Wt 134.0 lb

## 2020-03-07 DIAGNOSIS — Z124 Encounter for screening for malignant neoplasm of cervix: Secondary | ICD-10-CM

## 2020-03-07 DIAGNOSIS — R102 Pelvic and perineal pain: Secondary | ICD-10-CM

## 2020-03-07 DIAGNOSIS — N857 Hematometra: Secondary | ICD-10-CM

## 2020-03-07 NOTE — Progress Notes (Signed)
Gynecology Pelvic Pain Evaluation   Chief Complaint:  Chief Complaint  Patient presents with  . ER follow up    Pelvic pain (CT scan in ER)    History of Present Illness:   Patient is a 39 y.o. No obstetric history on file. who LMP was Patient's last menstrual period was 02/11/2017., presents today for a problem visit.  She complains of pelvic pain.   Her pain is localized to the suprapubic area, described as intermittent, sharp and stabbing, began several years ago and its severity is described as moderate. The pain radiates to the  Non-radiating. She has not had normal menstrual blood flow, denies history of ablation, reports remote history of D&C in 20's.   Patient has these modifiers which include relaxation, pain medication and heating pad that make it better and movement that make it worse.    Previous evaluation/Treatment: Reports prior D&C by a Dr. Tamala Julian at [facility] in [place] for heavy menstrual bleeding.  Denies history of prior LEEP or ablation.  Two prior C-sections.  Has light intermittent spotting but no regular menstrual flow.  Does report significant cramping with cycles.    Review of Systems: Review of Systems  Constitutional: Negative.   Gastrointestinal: Positive for abdominal pain. Negative for constipation and diarrhea.  Genitourinary: Negative.   Endo/Heme/Allergies: Does not bruise/bleed easily.    Past Medical History:  There are no problems to display for this patient.   Past Surgical History:  Past Surgical History:  Procedure Laterality Date  . TUBAL LIGATION      Gynecologic History:  Patient's last menstrual period was 02/11/2017.   Obstetric History: No obstetric history on file.  Family History:  History reviewed. No pertinent family history.  Social History:  Social History   Socioeconomic History  . Marital status: Legally Separated    Spouse name: Not on file  . Number of children: Not on file  . Years of education: Not on  file  . Highest education level: Not on file  Occupational History  . Not on file  Tobacco Use  . Smoking status: Current Every Day Smoker    Packs/day: 0.50    Types: Cigarettes  . Smokeless tobacco: Never Used  Substance and Sexual Activity  . Alcohol use: Yes    Comment: weekends  . Drug use: Never  . Sexual activity: Yes    Birth control/protection: None  Other Topics Concern  . Not on file  Social History Narrative  . Not on file   Social Determinants of Health   Financial Resource Strain:   . Difficulty of Paying Living Expenses:   Food Insecurity:   . Worried About Charity fundraiser in the Last Year:   . Arboriculturist in the Last Year:   Transportation Needs:   . Film/video editor (Medical):   Marland Kitchen Lack of Transportation (Non-Medical):   Physical Activity:   . Days of Exercise per Week:   . Minutes of Exercise per Session:   Stress:   . Feeling of Stress :   Social Connections:   . Frequency of Communication with Friends and Family:   . Frequency of Social Gatherings with Friends and Family:   . Attends Religious Services:   . Active Member of Clubs or Organizations:   . Attends Archivist Meetings:   Marland Kitchen Marital Status:   Intimate Partner Violence:   . Fear of Current or Ex-Partner:   . Emotionally Abused:   .  Physically Abused:   . Sexually Abused:     Allergies:  No Known Allergies  Medications: Prior to Admission medications   Medication Sig Start Date End Date Taking? Authorizing Provider  acetaminophen (TYLENOL) 500 MG tablet Take 500-1,000 mg by mouth every 6 (six) hours as needed for mild pain or fever.    [provider]  diphenhydrAMINE (BENADRYL) 25 mg capsule Take 25 mg by mouth every 6 (six) hours as needed for allergies.    [provider]  oxyCODONE-acetaminophen (PERCOCET) 5-325 MG tablet Take 1 tablet by mouth every 4 (four) hours as needed for severe pain. 02/09/20   [name], MD    Physical  Exam Vitals: Last menstrual period 02/11/2017.  General: NAD HEENT: normocephalic, anicteric Pulmonary: No increased work of breathing Abdomen: NABS, soft, non-tender, non-distended.  Umbilicus without lesions.  No hepatomegaly, splenomegaly or masses palpable. No evidence of hernia  Genitourinary:  External: Normal external female genitalia.  Normal urethral meatus, normal Bartholin's and Skene's glands.    Vagina: Normal vaginal mucosa, no evidence of prolapse.    Cervix: Grossly normal in appearance, no bleeding, unable to tolerate attempts at cannulation/dilation.  There does appear to be resistance about 1.5cm into the external cervical os  Uterus: Non-enlarged, mobile, normal contour.  No CMT  Adnexa: ovaries non-enlarged, no adnexal masses  Rectal: deferred  Lymphatic: no evidence of inguinal lymphadenopathy Extremities: no edema, erythema, or tenderness Neurologic: Grossly intact Psychiatric: mood appropriate, affect full  Female chaperone present for pelvic portion of the physical exam  CT ABDOMEN PELVIS W CONTRAST  Result Date: 02/09/2020 CLINICAL DATA:  39 year old female with right lower abdominal pain, nausea vomiting. History of hysterectomy. EXAM: CT ABDOMEN AND PELVIS WITH CONTRAST TECHNIQUE: Multidetector CT imaging of the abdomen and pelvis was performed using the standard protocol following bolus administration of intravenous contrast. CONTRAST:  142mL OMNIPAQUE IOHEXOL 300 MG/ML  SOLN COMPARISON:  CT Abdomen and Pelvis 02/28/2017. FINDINGS: Lower chest: Lower lung volumes, otherwise negative. Hepatobiliary: Contracted gallbladder with dense calcified gallstones in the gallbladder neck which appear chronic but increased. No pericholecystic inflammation. The CBD is now at the upper limits of normal, 6 mm versus 3-4 mm previously, along with minimally increased appearance of intrahepatic bile ducts (series 2, image 20). But no filling defect is identified in the distal CBD  which appears to taper. Liver enhancement remains normal. Pancreas: Negative. Spleen: Negative. Adrenals/Urinary Tract: Normal adrenal glands. Bilateral renal enhancement is symmetric and within normal limits. No hydronephrosis or perinephric stranding. No nephrolithiasis identified. Proximal ureters appear decompressed. The urinary bladder is mildly distended but otherwise unremarkable. Occasional pelvic phleboliths. Stomach/Bowel: Decompressed descending and rectosigmoid colon. Oral contrast has reached the splenic flexure without obstruction. Decompressed transverse colon. More normal appearing right colon, with evidence of a small normal gas containing appendix on coronal image 48. Adjacent terminal ileum appears normal. Proximal loops of jejunum from the ligament of Treitz are mildly distended with gas and contrast, at the upper limits of normal. Otherwise no dilated small bowel. Gas and contrast containing stomach and duodenum are otherwise normal. No mesenteric stranding. No free air or free fluid. Vascular/Lymphatic: Major arterial structures in the abdomen and pelvis are patent with no atherosclerosis. The central venous structures also appear patent. Portal venous system also appears patent. No lymphadenopathy. Reproductive: Progressed abnormal enlargement of the uterus with a chronic but enlarged and now nearly 6 cm cystic mass with intermediate density fluid (previously 4 cm in 2018). This is located at the fundus  eccentric to the left. There is a similar but smaller 15 mm oval low-density or fluid containing lesion at the right cornu (series 2, image 55) which is minimally increased since 2018). Both ovaries appear within normal limits. No parametrial inflammation identified. Other: No pelvic free fluid. Musculoskeletal: Chronic inferior pubic rami fractures are stable since 2018. Otherwise negative. IMPRESSION: 1. Chronic cholelithiasis with new borderline to mild CBD and intrahepatic biliary ductal  dilatation. No filling defect identified within the distal CBD, but if LFTs are abnormal then consider Choledocholithiasis. 2. 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 Hematometra now seems more likely than a degenerating fibroid. GYN consultation recommended. 3. Normal appendix, and no bowel inflammation identified. Electronically Signed   By: Genevie Ann M.D.   On: 02/09/2020 13:39      Assessment: 39 y.o. with hematometra   Problem List Items Addressed This Visit    None    Visit Diagnoses    Screening for malignant neoplasm of cervix    -  Primary   Relevant Orders   Cytology - PAP   Acute pelvic pain, female       Hematometra           1) We discussed the possible etiologies for pelvic pain in women.  Gynecologic causes may include endometriosis, adenomyosis, pelvic inflammatory disease (PID), ovarian cysts, ovarian or tubal torsion, and in rare case gynecologic malignancy such as cervical, uterine, or ovarian cancer.  In addition thee possibility of non-gynecologic etiologies such as urinary or GI tract pathology or disordered, as well as musculoskeletal problems.  The goal is to complete a basic work up in hopes of identifying the underlying cause which in turn will dictate treatment.  In the meantime supportive measures such as localized heat, and NSAIDs are reasonable first steps.     - Prescription drug database was not reviewed, UDS was not ordered - Transvaginal ultrasound not ordered but prior imaging CT/MRI reviewed - Blood work obtained today No  - Cervical cultures No - UA not ordered - pap uptdated today - Unable to tolerate dilation in office external cervix is normal, will post for hysteroscopy D&C in OR  2) No follow-ups on file.   Malachy Mood, MD, [facility], [facility] 03/07/2020, 10:42 AM

## 2020-03-08 LAB — CYTOLOGY - PAP
Comment: NEGATIVE
Diagnosis: NEGATIVE
High risk HPV: NEGATIVE

## 2020-03-24 ENCOUNTER — Telehealth: Payer: Self-pay | Admitting: Obstetrics and Gynecology

## 2020-03-24 NOTE — Telephone Encounter (Signed)
-----   Message from Malachy Mood, MD sent at 03/15/2020  6:51 PM EDT ----- Regarding: Surgery Surgery Booking Request Patient Full Name:  Jamie Bishop  MRN: [id number]  DOB: 1981/07/04  Surgeon: Malachy Mood, MD  Requested Surgery Date and Time: 2-4  weeks Primary Diagnosis AND Code: Hematometra N85.7 Secondary Diagnosis and Code:  Surgical Procedure: Hysteroscopy D&C L&D Notification: No Admission Status: same day surgery Length of Surgery: 1hr Special Case Needs: No H&P: Yes Phone Interview???:  Yes Interpreter: No Language:  Medical Clearance:  No Special Scheduling Instructions:  Any known health/anesthesia issues, diabetes, sleep apnea, latex allergy, defibrillator/pacemaker?: No Acuity: P2   (P1 highest, P2 delay may cause harm, P3 low, elective gyn, P4 lowest)

## 2020-03-24 NOTE — Telephone Encounter (Signed)
Called to schedule Hysteroscopy D&C with [facility]  DOS 6/24   H&P 6/14 @ 4:30   Covid testing 6/22 @ 8-10:30, [address], drive up and wear mask. Advised pt to quarantine until DOS.  Pre-admit phone call appointment to be requested - date and time will be included on H&P paper work. Explained that this appointment has a call window. Based on the time scheduled will indicate if the call will be received within a 4 hour window before 1:00 or after.  Advised that pt may also receive calls from the hospital pharmacy and pre-service center.  Confirmed pt has no insurance and is SELF PAY. I gave pt the [facility] # [phone]

## 2020-03-28 ENCOUNTER — Encounter: Payer: Self-pay | Admitting: Obstetrics and Gynecology

## 2020-03-30 ENCOUNTER — Telehealth: Payer: Self-pay | Admitting: Obstetrics and Gynecology

## 2020-03-30 ENCOUNTER — Inpatient Hospital Stay: Admission: RE | Admit: 2020-03-30 | Payer: Self-pay | Source: Ambulatory Visit

## 2020-03-30 NOTE — Telephone Encounter (Signed)
Called pt to reschedule H&P with [facility] and to confirm that she was still wanting to go forward with the procedure.   She indicated that she had an emergency that resulted in her missing her previously scheduled H&P on 6/14. I adv that Dr Georgianne Fick is booked but I will try to work her into his schedule. She has been rescheduled for 6/18 @ 3:10

## 2020-04-01 ENCOUNTER — Encounter: Payer: Self-pay | Admitting: Obstetrics and Gynecology

## 2020-04-05 ENCOUNTER — Other Ambulatory Visit
Admission: RE | Admit: 2020-04-05 | Discharge: 2020-04-05 | Disposition: A | Discharge status: Home / Self Care | Payer: Self-pay | Source: Ambulatory Visit | Attending: Obstetrics and Gynecology | Admitting: Obstetrics and Gynecology

## 2020-04-05 ENCOUNTER — Telehealth: Payer: Self-pay | Admitting: Obstetrics and Gynecology

## 2020-04-05 NOTE — Telephone Encounter (Signed)
Rec'd call from pre service ctr saying that pt did not come for covid testing today. She also did not have her pre-admit appt.  DOS 6/24  I called and left msg asking that she rtn my call and let me know her intentions - go forward with surgery or reschedule for a later date.

## 2020-04-05 NOTE — Pre-Procedure Instructions (Signed)
Contacted patient via phone today to remind her that she missed her Covid testing appointment.  She said that she was in [place] and would not be able to make it today.  I asked if she would be able to be here early tomorrow for testing and she said that she was not sure. I will notify Dr. Danielle Rankin office.

## 2020-04-06 NOTE — Telephone Encounter (Signed)
I heard from pre-service yesterday afternoon about pt not showing for Covid testing. I called pt and left msg for her to rtn call. I have yet to hear back from her. Pre-service has called me again to see what to do. I asked if she showed up in the am what would happen. I was told that she was almost certain they would not do a rapid test since she no showed yesterday.  After consulting with Dr Georgianne Fick he adv to take pt off the schedule and cancel the case.  I faxed the cancellation to OR posting [phone] after several failed attempts to reach them by phone and asked that they confirm receipt.

## 2020-04-07 ENCOUNTER — Ambulatory Visit: Admission: RE | Admit: 2020-04-07 | Payer: Self-pay | Source: Home / Self Care | Admitting: Obstetrics and Gynecology

## 2020-04-07 ENCOUNTER — Encounter: Admission: RE | Payer: Self-pay | Source: Home / Self Care

## 2020-04-07 SURGERY — DILATATION AND CURETTAGE /HYSTEROSCOPY
Anesthesia: Choice

## 2020-05-16 ENCOUNTER — Telehealth: Payer: Self-pay | Admitting: Obstetrics and Gynecology

## 2020-05-20 NOTE — Telephone Encounter (Signed)
error 

## 2020-05-27 ENCOUNTER — Telehealth: Payer: Self-pay | Admitting: Obstetrics and Gynecology

## 2020-05-27 NOTE — Telephone Encounter (Signed)
Pt called 8/4 and adv that she was ready to reschedule surgery w Georgianne Fick for Hysteroscopy D&C (orig sch in 6/21 - pt was no show to Covid, PAT and H&P). I rescheduled her for 8/26 with H&P on 8/17.  I rec'd call from pt today saying that she needed a Friday appt for her H&P. I adv that [facility] first Friday is 9/3 and therefore we would have to reschedule the surgery. She understood and agreed to do so.  DOS 9/9  H&P 9/3 @ 3:50   Covid testing 9/7 @ 8-10:30, [address], drive up and wear mask. Advised pt to quarantine until DOS.  Pre-admit phone call appointment to be requested - date and time will be included on H&P paper work.  Advised that pt may also receive calls from the hospital pharmacy and pre-service center.  Confirmed pt is self pay.

## 2020-05-31 ENCOUNTER — Encounter: Payer: Self-pay | Admitting: Obstetrics and Gynecology

## 2020-06-02 ENCOUNTER — Inpatient Hospital Stay: Admission: RE | Admit: 2020-06-02 | Payer: Self-pay | Source: Ambulatory Visit

## 2020-06-07 ENCOUNTER — Other Ambulatory Visit: Payer: Self-pay

## 2020-06-17 ENCOUNTER — Encounter
Admission: RE | Admit: 2020-06-17 | Discharge: 2020-06-17 | Disposition: A | Discharge status: Home / Self Care | Payer: Self-pay | Source: Ambulatory Visit | Attending: Obstetrics and Gynecology | Admitting: Obstetrics and Gynecology

## 2020-06-17 ENCOUNTER — Encounter: Payer: Self-pay | Admitting: Obstetrics and Gynecology

## 2020-06-17 ENCOUNTER — Other Ambulatory Visit: Payer: Self-pay

## 2020-06-17 ENCOUNTER — Ambulatory Visit (INDEPENDENT_AMBULATORY_CARE_PROVIDER_SITE_OTHER): Payer: Self-pay | Admitting: Obstetrics and Gynecology

## 2020-06-17 VITALS — BP 148/88 | HR 105 | Ht 59.0 in | Wt 134.0 lb

## 2020-06-17 DIAGNOSIS — Z01818 Encounter for other preprocedural examination: Secondary | ICD-10-CM

## 2020-06-17 DIAGNOSIS — N882 Stricture and stenosis of cervix uteri: Secondary | ICD-10-CM

## 2020-06-17 DIAGNOSIS — N857 Hematometra: Secondary | ICD-10-CM

## 2020-06-17 HISTORY — DX: Headache, unspecified: R51.9

## 2020-06-17 HISTORY — DX: History of uterine scar from previous surgery: Z98.891

## 2020-06-17 NOTE — Progress Notes (Signed)
Obstetrics & Gynecology Surgery H&P    Chief Complaint: Scheduled Surgery   History of Present Illness: Patient is a 39 y.o. [id number] presenting for scheduled hsyteroscopy D&C for the treatment or further evaluation of hematometria.   Prior Treatments prior to proceeding with surgery include: imaging  Preoperative Pap: 5/24/20201 Results: NILM HPV negative  Preoperative Endometrial biopsy: N/A secondary to cervical stenosis Preoperative Ultrasound: Ultrasound 02/28/2017 showing large intramural mass with internal fluid levels read as degenerating uterine fibroid.  CT scan 02/09/2020 increasing intrauterine collection of complex fluid felt to reflect hematometra rather than degenerating uterine fibroid and consistent with patient reporting no vaginal bleeding   Review of Systems:10 point review of systems  Past Medical History:  There are no problems to display for this patient.   Past Surgical History:  Past Surgical History:  Procedure Laterality Date  . TUBAL LIGATION      Family History:  History reviewed. No pertinent family history.  Social History:  Social History   Socioeconomic History  . Marital status: Legally Separated    Spouse name: Not on file  . Number of children: Not on file  . Years of education: Not on file  . Highest education level: Not on file  Occupational History  . Not on file  Tobacco Use  . Smoking status: Current Every Day Smoker    Packs/day: 0.50    Types: Cigarettes  . Smokeless tobacco: Never Used  Vaping Use  . Vaping Use: Never used  Substance and Sexual Activity  . Alcohol use: Yes    Comment: weekends  . Drug use: Never  . Sexual activity: Yes    Birth control/protection: None  Other Topics Concern  . Not on file  Social History Narrative  . Not on file   Social Determinants of Health   Financial Resource Strain:   . Difficulty of Paying Living Expenses: Not on file  Food Insecurity:   . Worried About Sales executive in the Last Year: Not on file  . Ran Out of Food in the Last Year: Not on file  Transportation Needs:   . Lack of Transportation (Medical): Not on file  . Lack of Transportation (Non-Medical): Not on file  Physical Activity:   . Days of Exercise per Week: Not on file  . Minutes of Exercise per Session: Not on file  Stress:   . Feeling of Stress : Not on file  Social Connections:   . Frequency of Communication with Friends and Family: Not on file  . Frequency of Social Gatherings with Friends and Family: Not on file  . Attends Religious Services: Not on file  . Active Member of Clubs or Organizations: Not on file  . Attends Archivist Meetings: Not on file  . Marital Status: Not on file  Intimate Partner Violence:   . Fear of Current or Ex-Partner: Not on file  . Emotionally Abused: Not on file  . Physically Abused: Not on file  . Sexually Abused: Not on file    Allergies:  No Known Allergies  Medications: Prior to Admission medications   Medication Sig Start Date End Date Taking? Authorizing Provider  acetaminophen (TYLENOL) 500 MG tablet Take 1,000 mg by mouth every 6 (six) hours as needed for mild pain or fever.    Yes [provider]  aspirin-acetaminophen-caffeine (EXCEDRIN MIGRAINE) [phone] MG tablet Take 1 tablet by mouth every 6 (six) hours as needed for headache.   Yes [provider]  oxyCODONE-acetaminophen (PERCOCET) 5-325 MG tablet Take 1 tablet by mouth every 4 (four) hours as needed for severe pain. 02/09/20  Yes [name], MD    Physical Exam Vitals: Blood pressure (!) 148/88, pulse (!) 105, height 4\' 11"  (1.499 m), weight 134 lb (60.8 kg). General: NAD HEENT: normocephalic, anicteric Pulmonary: No increased work of breathing, CTAB Cardiovascular: RRR, distal pulses 2+ Abdomen: soft, non-tender, non-distended Extremities: no edema, erythema, or tenderness Neurologic: Grossly intact Psychiatric: mood appropriate,  affect full  Imaging No results found.  Assessment: 39 y.o. [id number] presenting for scheduled hsyteroscopy, D&C  Plan: 1) I have discussed with the patient the indications for the procedure. Included in the discussion were the options of therapy, as wall as their individual risks, benefits, and complications. Ample time was given to answer all questions.   In office pipelle biopsy generally provides comparable results to [facility], however this sampling modality may miss focal lesions if these were previously documented on ultrasound.  It is because of the potential to miss focal lesions that hysteroscopy D&C is also warranted in patient with continued postmenopausal bleeding that is not self limited regardless of prior in office biopsy results or ultrasound findings.  She understands that the risk of continued observation include worsening bleeding or worsening of any underlying pathology.  The choices include: 1. Doing nothing but following her symptoms 2. Attempts at hormonal manipulation with either BCP or Depo-Provera for premenopausal patients with no concern for focal lesion or endometrial pathology 3. D&C/hysteroscopy. 4. Endometrial ablation via Novasure or other techniques for premenopausal patients with no concern for focal lesion or endometrial pathology  5. As final resort, hysterectomy. After consideration of her history and findings, mutual decision has been made to proceed with D+C/hysteroscopy. While the incidence is low, the risks from this surgery include, but are not limited to, the risks of anesthesia, hemorrhage, infection, perforation, and injury to adjacent structures including bowel, bladder and blood vessels.    2) Routine postoperative instructions were reviewed with the patient and her family in detail today including the expected length of recovery and likely postoperative course.  The patient concurred with the proposed plan, giving informed written consent for the surgery today.   Patient instructed on the importance of being NPO after midnight prior to her procedure.  If warranted preoperative prophylactic antibiotics and SCDs ordered on call to the OR to meet SCIP guidelines and adhere to recommendation laid forth in [place] Number 104 May 2009  "Antibiotic Prophylaxis for Gynecologic Procedures".     Malachy Mood, MD, [facility], [facility] 06/17/2020, 4:50 PM

## 2020-06-17 NOTE — Patient Instructions (Signed)
Your procedure is scheduled on: Thurs 9/7 Report to Day Surgery. To find out your arrival time please call [phone] between 1PM - 3PM on. Wed. 9/6  Remember: Instructions that are not followed completely may result in serious medical risk,  up to and including death, or upon the discretion of your surgeon and anesthesiologist your  surgery may need to be rescheduled.     _X__ 1. Do not eat food after midnight the night before your procedure.                 No chewing gum or hard candies. You may drink clear liquids up to 2 hours                 before you are scheduled to arrive for your surgery- DO not drink clear                 liquids within 2 hours of the start of your surgery.                 Clear Liquids include:  water, apple juice without pulp, clear Gatorade, G2 or                  Gatorade Zero (avoid Red/Purple/Blue), Black Coffee or Tea (Do not add                 anything to coffee or tea). __x___2.   Complete the "Ensure Clear Pre-surgery Clear Carbohydrate Drink" provided to you, 2 hours before arrival.  __X__2.  On the morning of surgery brush your teeth with toothpaste and water, you                may rinse your mouth with mouthwash if you wish.  Do not swallow any toothpaste of mouthwash.     _X__ 3.  No Alcohol for 24 hours before or after surgery.   _X__ 4.  Do Not Smoke or use e-cigarettes For 24 Hours Prior to Your Surgery.                 Do not use any chewable tobacco products for at least 6 hours prior to                 Surgery.  __  5.  Do not use any recreational drugs (marijuana, cocaine, heroin, ecstasy, MDMA or other)                For at least one week prior to your surgery.  Combination of these drugs with anesthesia                May have life threatening results.  ____  6.  Bring all medications with you on the day of surgery if instructed.   __x__  7.  Notify your doctor if there is any change in your medical  condition      (cold, fever, infections).     Do not wear jewelry, make-up, hairpins, clips or nail polish. Do not wear lotions, powders, or perfumes. You may wear deodorant. Do not shave 48 hours prior to surgery.  Do not bring valuables to the hospital.    [facility] is not responsible for any belongings or valuables.  Contacts, dentures or bridgework may not be worn into surgery. Leave your suitcase in the car. After surgery it may be brought to your room. For patients admitted to the hospital, discharge time is determined by your treatment team.  Patients discharged the day of surgery will not be allowed to drive home.   Make arrangements for someone to be with you for the first 24 hours of your Same Day Discharge.    Please read over the following fact sheets that you were given:      __x__ Take these medicines the morning of surgery with A SIP OF WATER:    1. acetaminophen (TYLENOL) 500 MG tablet or oxyCODONE-acetaminophen (PERCOCET) 5-325 MG tablet if needed  2.   3.   4.  5.  6.  ____ Fleet Enema (as directed)   __x__shower the night before and the morning of surgery with your usual soap products  ____ Use inhalers on the day of surgery  ____ Stop metformin 2 days prior to surgery    ____ Take 1/2 of usual insulin dose the night before surgery. No insulin the morning          of surgery.   ____ Stop Coumadin/Plavix/aspirin on   _x___ Stop Anti-inflammatories aspirin-acetaminophen-caffeine (EXCEDRIN MIGRAINE) [phone] MG tablet ibuprofen aleve today   May take tyenol   ____ Stop supplements until after surgery.    ____ Bring C-Pap to the hospital.    If you have any questions regarding your pre-procedure instructions,  Please call Pre-admit Testing at [place]

## 2020-06-17 NOTE — H&P (View-Only) (Signed)
Obstetrics & Gynecology Surgery H&P    Chief Complaint: Scheduled Surgery   History of Present Illness: Patient is a 39 y.o. [id number] presenting for scheduled hsyteroscopy D&C for the treatment or further evaluation of hematometria.   Prior Treatments prior to proceeding with surgery include: imaging  Preoperative Pap: 5/24/20201 Results: NILM HPV negative  Preoperative Endometrial biopsy: N/A secondary to cervical stenosis Preoperative Ultrasound: Ultrasound 02/28/2017 showing large intramural mass with internal fluid levels read as degenerating uterine fibroid.  CT scan 02/09/2020 increasing intrauterine collection of complex fluid felt to reflect hematometra rather than degenerating uterine fibroid and consistent with patient reporting no vaginal bleeding   Review of Systems:10 point review of systems  Past Medical History:  There are no problems to display for this patient.   Past Surgical History:  Past Surgical History:  Procedure Laterality Date  . TUBAL LIGATION      Family History:  History reviewed. No pertinent family history.  Social History:  Social History   Socioeconomic History  . Marital status: Legally Separated    Spouse name: Not on file  . Number of children: Not on file  . Years of education: Not on file  . Highest education level: Not on file  Occupational History  . Not on file  Tobacco Use  . Smoking status: Current Every Day Smoker    Packs/day: 0.50    Types: Cigarettes  . Smokeless tobacco: Never Used  Vaping Use  . Vaping Use: Never used  Substance and Sexual Activity  . Alcohol use: Yes    Comment: weekends  . Drug use: Never  . Sexual activity: Yes    Birth control/protection: None  Other Topics Concern  . Not on file  Social History Narrative  . Not on file   Social Determinants of Health   Financial Resource Strain:   . Difficulty of Paying Living Expenses: Not on file  Food Insecurity:   . Worried About Sales executive in the Last Year: Not on file  . Ran Out of Food in the Last Year: Not on file  Transportation Needs:   . Lack of Transportation (Medical): Not on file  . Lack of Transportation (Non-Medical): Not on file  Physical Activity:   . Days of Exercise per Week: Not on file  . Minutes of Exercise per Session: Not on file  Stress:   . Feeling of Stress : Not on file  Social Connections:   . Frequency of Communication with Friends and Family: Not on file  . Frequency of Social Gatherings with Friends and Family: Not on file  . Attends Religious Services: Not on file  . Active Member of Clubs or Organizations: Not on file  . Attends Archivist Meetings: Not on file  . Marital Status: Not on file  Intimate Partner Violence:   . Fear of Current or Ex-Partner: Not on file  . Emotionally Abused: Not on file  . Physically Abused: Not on file  . Sexually Abused: Not on file    Allergies:  No Known Allergies  Medications: Prior to Admission medications   Medication Sig Start Date End Date Taking? Authorizing Provider  acetaminophen (TYLENOL) 500 MG tablet Take 1,000 mg by mouth every 6 (six) hours as needed for mild pain or fever.    Yes [provider]  aspirin-acetaminophen-caffeine (EXCEDRIN MIGRAINE) [phone] MG tablet Take 1 tablet by mouth every 6 (six) hours as needed for headache.   Yes [provider]  oxyCODONE-acetaminophen (PERCOCET) 5-325 MG tablet Take 1 tablet by mouth every 4 (four) hours as needed for severe pain. 02/09/20  Yes [name], MD    Physical Exam Vitals: Blood pressure (!) 148/88, pulse (!) 105, height 4\' 11"  (1.499 m), weight 134 lb (60.8 kg). General: NAD HEENT: normocephalic, anicteric Pulmonary: No increased work of breathing, CTAB Cardiovascular: RRR, distal pulses 2+ Abdomen: soft, non-tender, non-distended Extremities: no edema, erythema, or tenderness Neurologic: Grossly intact Psychiatric: mood appropriate,  affect full  Imaging No results found.  Assessment: 39 y.o. [id number] presenting for scheduled hsyteroscopy, D&C  Plan: 1) I have discussed with the patient the indications for the procedure. Included in the discussion were the options of therapy, as wall as their individual risks, benefits, and complications. Ample time was given to answer all questions.   In office pipelle biopsy generally provides comparable results to [facility], however this sampling modality may miss focal lesions if these were previously documented on ultrasound.  It is because of the potential to miss focal lesions that hysteroscopy D&C is also warranted in patient with continued postmenopausal bleeding that is not self limited regardless of prior in office biopsy results or ultrasound findings.  She understands that the risk of continued observation include worsening bleeding or worsening of any underlying pathology.  The choices include: 1. Doing nothing but following her symptoms 2. Attempts at hormonal manipulation with either BCP or Depo-Provera for premenopausal patients with no concern for focal lesion or endometrial pathology 3. D&C/hysteroscopy. 4. Endometrial ablation via Novasure or other techniques for premenopausal patients with no concern for focal lesion or endometrial pathology  5. As final resort, hysterectomy. After consideration of her history and findings, mutual decision has been made to proceed with D+C/hysteroscopy. While the incidence is low, the risks from this surgery include, but are not limited to, the risks of anesthesia, hemorrhage, infection, perforation, and injury to adjacent structures including bowel, bladder and blood vessels.    2) Routine postoperative instructions were reviewed with the patient and her family in detail today including the expected length of recovery and likely postoperative course.  The patient concurred with the proposed plan, giving informed written consent for the surgery today.   Patient instructed on the importance of being NPO after midnight prior to her procedure.  If warranted preoperative prophylactic antibiotics and SCDs ordered on call to the OR to meet SCIP guidelines and adhere to recommendation laid forth in [place] Number 104 May 2009  "Antibiotic Prophylaxis for Gynecologic Procedures".     Malachy Mood, MD, [facility], [facility] 06/17/2020, 4:50 PM

## 2020-06-21 ENCOUNTER — Other Ambulatory Visit
Admission: RE | Admit: 2020-06-21 | Discharge: 2020-06-21 | Disposition: A | Discharge status: Home / Self Care | Payer: HRSA Program | Source: Ambulatory Visit | Attending: Obstetrics and Gynecology | Admitting: Obstetrics and Gynecology

## 2020-06-21 ENCOUNTER — Other Ambulatory Visit: Payer: Self-pay

## 2020-06-21 DIAGNOSIS — Z20822 Contact with and (suspected) exposure to covid-19: Secondary | ICD-10-CM | POA: Diagnosis not present

## 2020-06-21 DIAGNOSIS — Z01812 Encounter for preprocedural laboratory examination: Secondary | ICD-10-CM | POA: Diagnosis present

## 2020-06-22 LAB — SARS CORONAVIRUS 2 (TAT 6-24 HRS): SARS Coronavirus 2: NEGATIVE

## 2020-06-23 ENCOUNTER — Ambulatory Visit: Payer: Self-pay | Admitting: Urgent Care

## 2020-06-23 ENCOUNTER — Encounter: Payer: Self-pay | Admitting: Obstetrics and Gynecology

## 2020-06-23 ENCOUNTER — Encounter
Admission: RE | Disposition: A | Discharge status: Home / Self Care | Payer: Self-pay | Source: Home / Self Care | Attending: Obstetrics and Gynecology

## 2020-06-23 ENCOUNTER — Ambulatory Visit
Admission: RE | Admit: 2020-06-23 | Discharge: 2020-06-23 | Disposition: A | Discharge status: Home / Self Care | Payer: Self-pay | Attending: Obstetrics and Gynecology | Admitting: Obstetrics and Gynecology

## 2020-06-23 ENCOUNTER — Other Ambulatory Visit: Payer: Self-pay

## 2020-06-23 DIAGNOSIS — N950 Postmenopausal bleeding: Secondary | ICD-10-CM | POA: Insufficient documentation

## 2020-06-23 DIAGNOSIS — N856 Intrauterine synechiae: Secondary | ICD-10-CM | POA: Insufficient documentation

## 2020-06-23 DIAGNOSIS — N857 Hematometra: Secondary | ICD-10-CM | POA: Insufficient documentation

## 2020-06-23 DIAGNOSIS — Z79899 Other long term (current) drug therapy: Secondary | ICD-10-CM | POA: Insufficient documentation

## 2020-06-23 DIAGNOSIS — Z7982 Long term (current) use of aspirin: Secondary | ICD-10-CM | POA: Insufficient documentation

## 2020-06-23 DIAGNOSIS — N882 Stricture and stenosis of cervix uteri: Secondary | ICD-10-CM | POA: Insufficient documentation

## 2020-06-23 DIAGNOSIS — D251 Intramural leiomyoma of uterus: Secondary | ICD-10-CM | POA: Insufficient documentation

## 2020-06-23 DIAGNOSIS — R102 Pelvic and perineal pain: Secondary | ICD-10-CM | POA: Insufficient documentation

## 2020-06-23 DIAGNOSIS — F17210 Nicotine dependence, cigarettes, uncomplicated: Secondary | ICD-10-CM | POA: Insufficient documentation

## 2020-06-23 HISTORY — PX: HYSTEROSCOPY WITH D & C: SHX1775

## 2020-06-23 LAB — POCT PREGNANCY, URINE: Preg Test, Ur: NEGATIVE

## 2020-06-23 SURGERY — DILATATION AND CURETTAGE /HYSTEROSCOPY
Anesthesia: General

## 2020-06-23 MED ORDER — LACTATED RINGERS IV SOLN: INTRAVENOUS | Status: DC

## 2020-06-23 MED ORDER — DEXAMETHASONE SODIUM PHOSPHATE 10 MG/ML IJ SOLN
INTRAMUSCULAR | Status: DC | PRN
  Administered 2020-06-23: 10 mg via INTRAVENOUS

## 2020-06-23 MED ORDER — OXYCODONE-ACETAMINOPHEN 5-325 MG PO TABS: 1.0000 | ORAL_TABLET | ORAL | 0 refills | Status: AC | PRN

## 2020-06-23 MED ORDER — EPHEDRINE 5 MG/ML INJ
INTRAVENOUS | Status: AC
  Filled 2020-06-23: qty 10

## 2020-06-23 MED ORDER — SILVER NITRATE-POT NITRATE 75-25 % EX MISC
CUTANEOUS | Status: AC
  Filled 2020-06-23: qty 10

## 2020-06-23 MED ORDER — CHLORHEXIDINE GLUCONATE 0.12 % MT SOLN
OROMUCOSAL | Status: AC
  Filled 2020-06-23: qty 15

## 2020-06-23 MED ORDER — IBUPROFEN 600 MG PO TABS
ORAL_TABLET | ORAL | Status: AC
  Administered 2020-06-23: 600 mg via ORAL
  Filled 2020-06-23: qty 1

## 2020-06-23 MED ORDER — FAMOTIDINE 20 MG PO TABS
20.0000 mg | ORAL_TABLET | Freq: Once | ORAL | Status: AC
  Administered 2020-06-23: 20 mg via ORAL

## 2020-06-23 MED ORDER — ONDANSETRON HCL 4 MG/2ML IJ SOLN: 4.0000 mg | Freq: Once | INTRAMUSCULAR | Status: DC | PRN

## 2020-06-23 MED ORDER — MIDAZOLAM HCL 2 MG/2ML IJ SOLN
INTRAMUSCULAR | Status: DC | PRN
  Administered 2020-06-23: 2 mg via INTRAVENOUS

## 2020-06-23 MED ORDER — LIDOCAINE HCL (CARDIAC) PF 100 MG/5ML IV SOSY
PREFILLED_SYRINGE | INTRAVENOUS | Status: DC | PRN
  Administered 2020-06-23: 80 mg via INTRAVENOUS

## 2020-06-23 MED ORDER — FENTANYL CITRATE (PF) 100 MCG/2ML IJ SOLN
INTRAMUSCULAR | Status: DC | PRN
  Administered 2020-06-23 (×4): 25 ug via INTRAVENOUS

## 2020-06-23 MED ORDER — FAMOTIDINE 20 MG PO TABS
ORAL_TABLET | ORAL | Status: AC
  Filled 2020-06-23: qty 1

## 2020-06-23 MED ORDER — ONDANSETRON HCL 4 MG/2ML IJ SOLN
INTRAMUSCULAR | Status: DC | PRN
  Administered 2020-06-23: 4 mg via INTRAVENOUS

## 2020-06-23 MED ORDER — IBUPROFEN 600 MG PO TABS: 600.0000 mg | ORAL_TABLET | Freq: Four times a day (QID) | ORAL | 0 refills | Status: AC | PRN

## 2020-06-23 MED ORDER — FENTANYL CITRATE (PF) 100 MCG/2ML IJ SOLN
25.0000 ug | INTRAMUSCULAR | Status: DC | PRN
  Administered 2020-06-23: 25 ug via INTRAVENOUS

## 2020-06-23 MED ORDER — IBUPROFEN 600 MG PO TABS
600.0000 mg | ORAL_TABLET | Freq: Four times a day (QID) | ORAL | Status: DC | PRN
  Filled 2020-06-23: qty 1

## 2020-06-23 MED ORDER — EPHEDRINE SULFATE 50 MG/ML IJ SOLN
INTRAMUSCULAR | Status: DC | PRN
  Administered 2020-06-23: 5 mg via INTRAVENOUS

## 2020-06-23 MED ORDER — IODINE STRONG (LUGOLS) 5 % PO SOLN
ORAL | Status: AC
  Filled 2020-06-23: qty 1

## 2020-06-23 MED ORDER — CHLORHEXIDINE GLUCONATE 0.12 % MT SOLN
15.0000 mL | Freq: Once | OROMUCOSAL | Status: AC
  Administered 2020-06-23: 15 mL via OROMUCOSAL

## 2020-06-23 MED ORDER — PROPOFOL 10 MG/ML IV BOLUS
INTRAVENOUS | Status: DC | PRN
  Administered 2020-06-23 (×2): 20 mg via INTRAVENOUS
  Administered 2020-06-23: 120 mg via INTRAVENOUS

## 2020-06-23 MED ORDER — ORAL CARE MOUTH RINSE: 15.0000 mL | Freq: Once | OROMUCOSAL | Status: AC

## 2020-06-23 MED ORDER — FENTANYL CITRATE (PF) 100 MCG/2ML IJ SOLN
INTRAMUSCULAR | Status: AC
  Administered 2020-06-23: 25 ug via INTRAVENOUS
  Filled 2020-06-23: qty 2

## 2020-06-23 MED ORDER — FERRIC SUBSULFATE 259 MG/GM EX SOLN
CUTANEOUS | Status: AC
  Filled 2020-06-23: qty 8

## 2020-06-23 MED ORDER — FENTANYL CITRATE (PF) 100 MCG/2ML IJ SOLN
INTRAMUSCULAR | Status: AC
  Filled 2020-06-23: qty 2

## 2020-06-23 MED ORDER — MIDAZOLAM HCL 2 MG/2ML IJ SOLN
INTRAMUSCULAR | Status: AC
  Filled 2020-06-23: qty 2

## 2020-06-23 MED ORDER — PROPOFOL 10 MG/ML IV BOLUS
INTRAVENOUS | Status: AC
  Filled 2020-06-23: qty 20

## 2020-06-23 SURGICAL SUPPLY — 25 items
CATH ROBINSON RED A/P 16FR (CATHETERS) ×2 IMPLANT
COVER WAND RF STERILE (DRAPES) IMPLANT
DEVICE MYOSURE LITE (MISCELLANEOUS) IMPLANT
ELECT REM PT RETURN 9FT ADLT (ELECTROSURGICAL)
ELECTRODE REM PT RTRN 9FT ADLT (ELECTROSURGICAL) IMPLANT
GLOVE BIO SURGEON STRL SZ7 (GLOVE) ×2 IMPLANT
GLOVE INDICATOR 7.5 STRL GRN (GLOVE) ×2 IMPLANT
GOWN STRL REUS W/ TWL LRG LVL3 (GOWN DISPOSABLE) ×2 IMPLANT
GOWN STRL REUS W/TWL LRG LVL3 (GOWN DISPOSABLE) ×4
INFUSOR MANOMETER BAG 3000ML (MISCELLANEOUS) IMPLANT
IV LACTATED RINGER IRRG 3000ML (IV SOLUTION)
IV LR IRRIG 3000ML ARTHROMATIC (IV SOLUTION) IMPLANT
IV NS IRRIG 3000ML ARTHROMATIC (IV SOLUTION) ×2 IMPLANT
KIT PROCEDURE FLUENT (KITS) ×2 IMPLANT
KIT TURNOVER CYSTO (KITS) ×2 IMPLANT
MYOSURE XL FIBROID (MISCELLANEOUS)
NDL DEFLUX 3.7X23X350 (MISCELLANEOUS) ×2
NEEDLE DEFLUX 3.7X23X350 (MISCELLANEOUS) ×1 IMPLANT
PACK DNC HYST (MISCELLANEOUS) ×2 IMPLANT
PAD OB MATERNITY 4.3X12.25 (PERSONAL CARE ITEMS) ×2 IMPLANT
PAD PREP 24X41 OB/GYN DISP (PERSONAL CARE ITEMS) ×2 IMPLANT
SEAL ROD LENS SCOPE MYOSURE (ABLATOR) ×2 IMPLANT
SYSTEM TISS REMOVAL MYOSURE XL (MISCELLANEOUS) IMPLANT
TOWEL OR 17X26 4PK STRL BLUE (TOWEL DISPOSABLE) ×2 IMPLANT
TUBING CONNECTING 10 (TUBING) ×2 IMPLANT

## 2020-06-23 NOTE — Anesthesia Postprocedure Evaluation (Signed)
Anesthesia Post Note  Patient: Jamie Bishop  Procedure(s) Performed: Ultrasound guided /HYSTEROSCOPY (N/A )  Patient location during evaluation: PACU Anesthesia Type: General Level of consciousness: awake and alert and oriented Pain management: pain level controlled Vital Signs Assessment: post-procedure vital signs reviewed and stable Respiratory status: spontaneous breathing Cardiovascular status: blood pressure returned to baseline Anesthetic complications: no   No complications documented.   Last Vitals:  Vitals:   06/23/20 1309 06/23/20 1317  BP: (!) 132/98 (!) 145/97  Pulse: 86 82  Resp: 15 14  Temp:    SpO2: 100% 98%    Last Pain:  Vitals:   06/23/20 1317  TempSrc:   PainSc: 4                  Victorious Cosio

## 2020-06-23 NOTE — Anesthesia Procedure Notes (Signed)
Procedure Name: LMA Insertion Date/Time: 06/23/2020 11:44 AM Performed by: Levert Feinstein, RN Pre-anesthesia Checklist: Patient identified, Patient being monitored, Timeout performed, Emergency Drugs available and Suction available Patient Re-evaluated:Patient Re-evaluated prior to induction Oxygen Delivery Method: Circle system utilized Preoxygenation: Pre-oxygenation with 100% oxygen Induction Type: IV induction Ventilation: Mask ventilation without difficulty LMA: LMA inserted LMA Size: 4.0 Tube type: Oral Number of attempts: 1 Placement Confirmation: positive ETCO2 and breath sounds checked- equal and bilateral Tube secured with: Tape Dental Injury: Teeth and Oropharynx as per pre-operative assessment

## 2020-06-23 NOTE — Op Note (Signed)
Preoperative Diagnosis: 1) 39 y.o. with hematometra  2) Cervical stenosis  Postoperative Diagnosis: 1) 39 y.o. 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  The cervix was able to be dilated about 3.5cm past the external cervical os before resistance was met.  A hysteroscope was advanced with dense scar tissue evident.  Using ultrasound guidance the hysteroscope was unable to be advanced past scar tissue.  A deflux needle advanced through the hysteroscopy and using ultrasound guidance was also unable to be advanced into the endometrial cavity for decompression.  Given degree of scarring encountered I had previously discussed that should the cavity be unable to be decompressed hysterectomy maybe the patient's best option long term.    Patient Condition: stable  Procedure in Detail:  Patient was taken to the operating room were she was administered general endotracheal anesthesia.  She was positioned in the dorsal lithotomy position utilizing Allen stirups, prepped and draped in the usual sterile fashion.  Uterus was noted to be 10 week size, anteverted.   Prior to proceeding with the case a time out was performed.  Attention was turned to the patient's pelvis.  A red rubber catheter was used to empty the patient's bladder.  An operative speculum was placed to allow visualization of the cervix.  The anterior lip of the cervix was  grasped with a single tooth tenaculum and the cervix was sequentially dilated using pratt dilators.  The hysteroscope was then advanced into the uterine cavity noting the above findings.  StPrentice DockerMD was operating aiding in obtaining transabdominal ultrasound views to help guide the hysteroscope.  Ultimately the uterine synechia where to dense to allow entry into the endometrial cavity.    The single tooth tenaculum was removed from the cervix.  The tenaculum sites and cervix were noted to be  Hemostatic before removing the operative speculum.  Sponge needle and instrument counts were corrects times two.  The patient tolerated the procedure well and was taken to the recovery room in stable condition.

## 2020-06-23 NOTE — Transfer of Care (Signed)
Immediate Anesthesia Transfer of Care Note  Patient: Jamie Bishop  Procedure(s) Performed: Ultrasound guided /HYSTEROSCOPY (N/A )  Patient Location: PACU  Anesthesia Type:General  Level of Consciousness: awake  Airway & Oxygen Therapy: Patient Spontanous Breathing and Patient connected to face mask oxygen  Post-op Assessment: Report given to RN and Post -op Vital signs reviewed and stable  Post vital signs: Reviewed and stable  Last Vitals:  Vitals Value Taken Time  BP 135/86 06/23/20 1246  Temp 36.3 C 06/23/20 1245  Pulse 85 06/23/20 1251  Resp 0 06/23/20 1251  SpO2 100 % 06/23/20 1251  Vitals shown include unvalidated device data.  Last Pain:  Vitals:   06/23/20 1245  TempSrc:   PainSc: 5       Patients Stated Pain Goal: 3 (59/45/85 9292)  Complications: No complications documented.

## 2020-06-23 NOTE — Discharge Instructions (Signed)
AMBULATORY SURGERY  DISCHARGE INSTRUCTIONS   1) The drugs that you were given will stay in your system until tomorrow so for the next 24 hours you should not:  A) Drive an automobile B) Make any legal decisions C) Drink any alcoholic beverage   2) You may resume regular meals tomorrow.  Today it is better to start with liquids and gradually work up to solid foods.  You may eat anything you prefer, but it is better to start with liquids, then soup and crackers, and gradually work up to solid foods.   3) Please notify your doctor immediately if you have any unusual bleeding, trouble breathing, redness and pain at the surgery site, drainage, fever, or pain not relieved by medication.    4) Additional Instructions:        Please contact your physician with any problems or Same Day Surgery at [phone], Monday through Friday 6 am to 4 pm, or [place] [facility] number at [phone]. Hysteroscopy, Care After This sheet gives you information about how to care for yourself after your procedure. Your health care provider may also give you more specific instructions. If you have problems or questions, contact your health care provider. What can I expect after the procedure? After the procedure, it is common to have: Cramping. Bleeding. This can vary from light spotting to menstrual-like bleeding. Follow these instructions at home: Activity Rest for 1-2 days after the procedure. Do not douche, use tampons, or have sex for 2 weeks after the procedure, or until your health care provider approves. Do not drive for 24 hours after the procedure, or for as long as told by your health care provider. Do not drive, use heavy machinery, or drink alcohol while taking prescription pain medicines. Medicines  Take over-the-counter and prescription medicines only as told by your health care provider. Do not take aspirin during recovery. It can increase the risk of bleeding. General  instructions Do not take baths, swim, or use a hot tub until your health care provider approves. Take showers instead of baths for 2 weeks, or for as long as told by your health care provider. To prevent or treat constipation while you are taking prescription pain medicine, your health care provider may recommend that you: Drink enough fluid to keep your urine clear or pale yellow. Take over-the-counter or prescription medicines. Eat foods that are high in fiber, such as fresh fruits and vegetables, whole grains, and beans. Limit foods that are high in fat and processed sugars, such as fried and sweet foods. Keep all follow-up visits as told by your health care provider. This is important. Contact a health care provider if: You feel dizzy or lightheaded. You feel nauseous. You have abnormal vaginal discharge. You have a rash. You have pain that does not get better with medicine. You have chills. Get help right away if: You have bleeding that is heavier than a normal menstrual period. You have a fever. You have pain or cramps that get worse. You develop new abdominal pain. You faint. You have pain in your shoulders. You have shortness of breath. Summary After the procedure, you may have cramping and some vaginal bleeding. Do not douche, use tampons, or have sex for 2 weeks after the procedure, or until your health care provider approves. Do not take baths, swim, or use a hot tub until your health care provider approves. Take showers instead of baths for 2 weeks, or for as long as told by your health  care provider. Report any unusual symptoms to your health care provider. Keep all follow-up visits as told by your health care provider. This is important. This information is not intended to replace advice given to you by your health care provider. Make sure you discuss any questions you have with your health care provider. Document Revised: 09/13/2017 Document Reviewed: 10/30/2016 Elsevier  Patient Education  [place].

## 2020-06-23 NOTE — Anesthesia Preprocedure Evaluation (Signed)
Anesthesia Evaluation  Patient identified by MRN, date of birth, ID band Patient awake    Reviewed: Allergy & Precautions, NPO status , Patient's Chart, lab work & pertinent test results  Airway Mallampati: III       Dental   Pulmonary asthma , Current Smoker and Patient abstained from smoking.,    Pulmonary exam normal        Cardiovascular negative cardio ROS       Neuro/Psych  Headaches, negative psych ROS   GI/Hepatic negative GI ROS, Neg liver ROS,   Endo/Other  negative endocrine ROS  Renal/GU negative Renal ROS  Female GU complaint     Musculoskeletal negative musculoskeletal ROS (+)   Abdominal Normal abdominal exam  (+)   Peds negative pediatric ROS (+)  Hematology negative hematology ROS (+)   Anesthesia Other Findings Past Medical History: No date: Asthma     Comment:  well controlled No date: H/O: cesarean section     Comment:  x 2 No date: Headache  Reproductive/Obstetrics                             Anesthesia Physical Anesthesia Plan  ASA: II  Anesthesia Plan: General   Post-op Pain Management:    Induction: Intravenous  PONV Risk Score and Plan:   Airway Management Planned: LMA  Additional Equipment:   Intra-op Plan:   Post-operative Plan: Extubation in OR  Informed Consent: I have reviewed the patients History and Physical, chart, labs and discussed the procedure including the risks, benefits and alternatives for the proposed anesthesia with the patient or authorized representative who has indicated his/her understanding and acceptance.     Dental advisory given  Plan Discussed with: CRNA and Surgeon  Anesthesia Plan Comments:         Anesthesia Quick Evaluation

## 2020-06-23 NOTE — Interval H&P Note (Signed)
History and Physical Interval Note:  06/23/2020 11:19 AM  Jamie Bishop  has presented today for surgery, with the diagnosis of Hematometra N85.7.  The various methods of treatment have been discussed with the patient and family. After consideration of risks, benefits and other options for treatment, the patient has consented to  Procedure(s): DILATATION AND CURETTAGE /HYSTEROSCOPY (N/A) as a surgical intervention.  The patient's history has been reviewed, patient examined, no change in status, stable for surgery.  I have reviewed the patient's chart and labs.  Questions were answered to the patient's satisfaction.     Malachy Mood

## 2020-06-23 NOTE — Progress Notes (Signed)
Pt ride cant get here for pick up until 6 pm.  Reg diet ordered.  Dr Georgianne Fick aware.  d

## 2020-06-24 ENCOUNTER — Telehealth: Payer: Self-pay

## 2020-06-24 ENCOUNTER — Encounter: Payer: Self-pay | Admitting: Obstetrics and Gynecology

## 2020-06-24 NOTE — Telephone Encounter (Signed)
Patient requested rx to be sent to [facility] at her visit yesterday. She is inquiring if it could be switched to [facility], [place] [address]. Cb#[phone]

## 2020-06-24 NOTE — Telephone Encounter (Signed)
Can't transfer narcotic

## 2020-06-27 NOTE — Telephone Encounter (Signed)
Patient is calling to follow up on medication request. Please advise.

## 2020-06-27 NOTE — Telephone Encounter (Signed)
I advised the patient of this on Friday prior to sending the message. Notified her that the rx would have to be cancelled at the original pharmacy prior to sending to the requested one. I did not have authority to do this and could only send the request with no guarantees that it would be done/approved.

## 2020-07-15 ENCOUNTER — Ambulatory Visit: Payer: Self-pay | Admitting: Obstetrics and Gynecology

## 2021-05-09 ENCOUNTER — Telehealth: Payer: Self-pay

## 2021-05-09 NOTE — Telephone Encounter (Signed)
Pt calling; is in pain - cramping at the bottom of her stomach; tylenol not working; has appt on the 18th; what to do in the meantime?  [phone]  pt states she has tried tylenol,, advil, motrin, heating pad, drinking water, drinking hot tea for the pain - nothing helps; asked if could be preg - pt states she shouldn't be; asked if she felt movement - she states she has but thought it was muscle; states it feels like ctxs q43mn; adv pt to do home preg test in the morning and let me know what it is.  Pt aware [facility] is in OR and On Call today but I will send message to him about what to do in the meantime since she has done everything I would adv her to do.

## 2021-05-10 ENCOUNTER — Ambulatory Visit: Payer: Self-pay

## 2021-06-01 ENCOUNTER — Ambulatory Visit: Payer: Self-pay | Admitting: Obstetrics and Gynecology

## 2021-06-02 NOTE — Telephone Encounter (Signed)
Pt had appt c [facility] yesterday and no showed.

## 2021-07-11 ENCOUNTER — Ambulatory Visit: Payer: Self-pay | Admitting: Physician Assistant

## 2021-07-11 ENCOUNTER — Ambulatory Visit: Payer: BLUE CROSS/BLUE SHIELD

## 2021-07-11 ENCOUNTER — Other Ambulatory Visit: Payer: Self-pay

## 2021-07-11 DIAGNOSIS — Z113 Encounter for screening for infections with a predominantly sexual mode of transmission: Secondary | ICD-10-CM

## 2021-07-11 LAB — WET PREP FOR TRICH, YEAST, CLUE
Clue Cell Exam: POSITIVE — AB
Trichomonas Exam: NEGATIVE
Yeast Exam: NEGATIVE

## 2021-07-13 ENCOUNTER — Encounter: Payer: Self-pay | Admitting: Physician Assistant

## 2021-07-13 NOTE — Progress Notes (Signed)
[facility] Department STI clinic/screening visit  Subjective:  Jamie Bishop is a 40 y.o. female being seen today for an STI screening visit. The patient reports they do have symptoms.  Patient reports that they do not desire a pregnancy in the next year.   They reported they are not interested in discussing contraception today.  No LMP recorded.   Patient has the following medical conditions:   Patient Active Problem List   Diagnosis Date Noted   Uterine synechiae    Hematometra     Chief Complaint  Patient presents with   SEXUALLY TRANSMITTED DISEASE    screening    HPI  Patient reports that she has had cramping off and on for 2 weeks.  Reports that she has had a history of irregular periods and has had to have a D&C at least 2 times due to not having had periods.  Denies chronic conditions and regular medicines.  States last HIV test was 2 yr ago and last pap was in May of 2021.   See flowsheet for further details and programmatic requirements.    The following portions of the patient's history were reviewed and updated as appropriate: allergies, current medications, past medical history, past social history, past surgical history and problem list.  Objective:  There were no vitals filed for this visit.  Physical Exam Constitutional:      General: She is not in acute distress.    Appearance: Normal appearance.  HENT:     Head: Normocephalic and atraumatic.     Comments: No nits,lice, or hair loss. No cervical, supraclavicular or axillary adenopathy.     Mouth/Throat:     Mouth: Mucous membranes are moist.     Pharynx: Oropharynx is clear. No oropharyngeal exudate or posterior oropharyngeal erythema.  Eyes:     Conjunctiva/sclera: Conjunctivae normal.  Pulmonary:     Effort: Pulmonary effort is normal.  Abdominal:     Palpations: Abdomen is soft. There is no mass.     Tenderness: There is no abdominal tenderness. There is no guarding or rebound.   Genitourinary:    General: Normal vulva.     Rectum: Normal.     Comments: External genitalia/pubic area without nits, lice, edema, erythema, lesions and inguinal adenopathy. Vagina with normal mucosa and discharge. Cervix without visible lesions. Uterus firm, mobile, nt, no masses, no CMT, no adnexal tenderness or fullness.  Musculoskeletal:     Cervical back: Neck supple. No tenderness.  Skin:    General: Skin is warm and dry.     Findings: No bruising, erythema, lesion or rash.  Neurological:     Mental Status: She is alert and oriented to person, place, and time.  Psychiatric:        Mood and Affect: Mood normal.        Behavior: Behavior normal.        Thought Content: Thought content normal.        Judgment: Judgment normal.     Assessment and Plan:  Jamie Bishop is a 40 y.o. female presenting to the [facility] Department for STI screening  1. Screening for STD (sexually transmitted disease) Patient into clinic without symptoms. Enc patient to follow up with her PCP/Gyn regarding symptoms and no periods. Rec condoms with all sex. Await test results.  Counseled that RN will call if needs to RTC for treatment once results are back.  - WET PREP FOR [place], YEAST, CLUE - Chlamydia/Gonorrhea [facility] - HIV  [facility] - Syphilis Serology, [facility] [place]     No follow-ups on file.  Future Appointments  Date Time Provider [place]  07/26/2021  1:30 PM Malachy Mood, MD [facility] None    [place], [place]

## 2021-07-26 ENCOUNTER — Ambulatory Visit: Payer: BLUE CROSS/BLUE SHIELD | Admitting: Obstetrics and Gynecology

## 2021-08-08 ENCOUNTER — Encounter (HOSPITAL_COMMUNITY): Payer: Self-pay | Admitting: Emergency Medicine

## 2021-08-08 ENCOUNTER — Emergency Department (HOSPITAL_COMMUNITY): Payer: BLUE CROSS/BLUE SHIELD

## 2021-08-08 ENCOUNTER — Emergency Department (HOSPITAL_COMMUNITY)
Admission: EM | Admit: 2021-08-08 | Discharge: 2021-08-08 | Disposition: A | Discharge status: Home / Self Care | Payer: BLUE CROSS/BLUE SHIELD | Attending: Emergency Medicine | Admitting: Emergency Medicine

## 2021-08-08 DIAGNOSIS — S41112A Laceration without foreign body of left upper arm, initial encounter: Secondary | ICD-10-CM

## 2021-08-08 DIAGNOSIS — S0232XA Fracture of orbital floor, left side, initial encounter for closed fracture: Secondary | ICD-10-CM

## 2021-08-08 DIAGNOSIS — T1490XA Injury, unspecified, initial encounter: Secondary | ICD-10-CM

## 2021-08-08 DIAGNOSIS — S0181XA Laceration without foreign body of other part of head, initial encounter: Secondary | ICD-10-CM

## 2021-08-08 DIAGNOSIS — S41111A Laceration without foreign body of right upper arm, initial encounter: Secondary | ICD-10-CM

## 2021-08-08 DIAGNOSIS — Z20822 Contact with and (suspected) exposure to covid-19: Secondary | ICD-10-CM | POA: Insufficient documentation

## 2021-08-08 DIAGNOSIS — S01512A Laceration without foreign body of oral cavity, initial encounter: Secondary | ICD-10-CM | POA: Insufficient documentation

## 2021-08-08 DIAGNOSIS — Y900 Blood alcohol level of less than 20 mg/100 ml: Secondary | ICD-10-CM | POA: Diagnosis not present

## 2021-08-08 DIAGNOSIS — S01511A Laceration without foreign body of lip, initial encounter: Secondary | ICD-10-CM | POA: Diagnosis not present

## 2021-08-08 DIAGNOSIS — S01411A Laceration without foreign body of right cheek and temporomandibular area, initial encounter: Secondary | ICD-10-CM | POA: Diagnosis not present

## 2021-08-08 DIAGNOSIS — S0990XA Unspecified injury of head, initial encounter: Secondary | ICD-10-CM | POA: Diagnosis present

## 2021-08-08 DIAGNOSIS — Z23 Encounter for immunization: Secondary | ICD-10-CM | POA: Insufficient documentation

## 2021-08-08 LAB — RESP PANEL BY RT-PCR (FLU A&B, COVID) ARPGX2
Influenza A by PCR: NEGATIVE
Influenza B by PCR: NEGATIVE
SARS Coronavirus 2 by RT PCR: NEGATIVE

## 2021-08-08 LAB — COMPREHENSIVE METABOLIC PANEL
ALT: 15 U/L (ref 0–44)
AST: 34 U/L (ref 15–41)
Albumin: 3.5 g/dL (ref 3.5–5.0)
Alkaline Phosphatase: 55 U/L (ref 38–126)
Anion gap: 11 (ref 5–15)
BUN: 8 mg/dL (ref 6–20)
CO2: 19 mmol/L — ABNORMAL LOW (ref 22–32)
Calcium: 8 mg/dL — ABNORMAL LOW (ref 8.9–10.3)
Chloride: 108 mmol/L (ref 98–111)
Creatinine, Ser: 0.98 mg/dL (ref 0.44–1.00)
GFR, Estimated: >60 mL/min (ref >60)
Glucose, Bld: 116 mg/dL — ABNORMAL HIGH (ref 70–99)
Potassium: 4 mmol/L (ref 3.5–5.1)
Sodium: 138 mmol/L (ref 135–145)
Total Bilirubin: 0.6 mg/dL (ref 0.3–1.2)
Total Protein: 6 g/dL — ABNORMAL LOW (ref 6.5–8.1)

## 2021-08-08 LAB — CBC
HCT: 36 % (ref 36.0–46.0)
Hemoglobin: 11.7 g/dL — ABNORMAL LOW (ref 12.0–15.0)
MCH: 31.1 pg (ref 26.0–34.0)
MCHC: 32.5 g/dL (ref 30.0–36.0)
MCV: 95.7 fL (ref 80.0–100.0)
Platelets: 229 10*3/uL (ref 150–400)
RBC: 3.76 MIL/uL — ABNORMAL LOW (ref 3.87–5.11)
RDW: 13.6 % (ref 11.5–15.5)
WBC: 8.4 10*3/uL (ref 4.0–10.5)
nRBC: 0 % (ref 0.0–0.2)

## 2021-08-08 LAB — I-STAT CHEM 8, ED
BUN: 7 mg/dL (ref 6–20)
Calcium, Ion: 0.98 mmol/L — ABNORMAL LOW (ref 1.15–1.40)
Chloride: 109 mmol/L (ref 98–111)
Creatinine, Ser: 0.8 mg/dL (ref 0.44–1.00)
Glucose, Bld: 114 mg/dL — ABNORMAL HIGH (ref 70–99)
HCT: 35 % — ABNORMAL LOW (ref 36.0–46.0)
Hemoglobin: 11.9 g/dL — ABNORMAL LOW (ref 12.0–15.0)
Potassium: 3.2 mmol/L — ABNORMAL LOW (ref 3.5–5.1)
Sodium: 140 mmol/L (ref 135–145)
TCO2: 18 mmol/L — ABNORMAL LOW (ref 22–32)

## 2021-08-08 LAB — I-STAT BETA HCG BLOOD, ED (MC, WL, AP ONLY): I-stat hCG, quantitative: <5 m[IU]/mL (ref <5)

## 2021-08-08 LAB — PROTIME-INR
INR: 1.1 (ref 0.8–1.2)
Prothrombin Time: 13.8 seconds (ref 11.4–15.2)

## 2021-08-08 LAB — TYPE AND SCREEN
ABO/RH(D): B POS
Antibody Screen: NEGATIVE

## 2021-08-08 LAB — ETHANOL: Alcohol, Ethyl (B): 19 mg/dL — ABNORMAL HIGH (ref <10)

## 2021-08-08 LAB — LACTIC ACID, PLASMA: Lactic Acid, Venous: 3.7 mmol/L (ref 0.5–1.9)

## 2021-08-08 MED ORDER — LIDOCAINE-EPINEPHRINE 1 %-1:100000 IJ SOLN
20.0000 mL | Freq: Once | INTRAMUSCULAR | Status: AC
  Administered 2021-08-08: 20 mL
  Filled 2021-08-08: qty 1

## 2021-08-08 MED ORDER — TETANUS-DIPHTH-ACELL PERTUSSIS 5-2.5-18.5 LF-MCG/0.5 IM SUSY
0.5000 mL | PREFILLED_SYRINGE | Freq: Once | INTRAMUSCULAR | Status: AC
  Administered 2021-08-08: 0.5 mL via INTRAMUSCULAR

## 2021-08-08 MED ORDER — CEFAZOLIN SODIUM-DEXTROSE 2-4 GM/100ML-% IV SOLN
2.0000 g | Freq: Once | INTRAVENOUS | Status: AC
  Administered 2021-08-08: 2 g via INTRAVENOUS

## 2021-08-08 MED ORDER — FENTANYL CITRATE PF 50 MCG/ML IJ SOSY: 50.0000 ug | PREFILLED_SYRINGE | Freq: Once | INTRAMUSCULAR | Status: DC

## 2021-08-08 MED ORDER — FENTANYL CITRATE (PF) 100 MCG/2ML IJ SOLN
INTRAMUSCULAR | Status: AC
  Administered 2021-08-08: 50 ug
  Filled 2021-08-08: qty 2

## 2021-08-08 MED ORDER — SODIUM CHLORIDE 0.9 % IV BOLUS
1000.0000 mL | Freq: Once | INTRAVENOUS | Status: AC
  Administered 2021-08-08: 1000 mL via INTRAVENOUS

## 2021-08-08 MED ORDER — ACETAMINOPHEN-CODEINE #3 300-30 MG PO TABS: 1.0000 | ORAL_TABLET | Freq: Four times a day (QID) | ORAL | 0 refills | Status: AC | PRN

## 2021-08-08 NOTE — Discharge Instructions (Signed)
No nose blowing. Please sneeze/cough with your mouth open.
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
## 2021-08-08 NOTE — ED Provider Notes (Signed)
..Laceration Repair  Date/Time: 08/08/2021 10:46 PM Performed by: Margarita Mail, PA-C Authorized by: Margarita Mail, PA-C   Consent:    Consent obtained:  Verbal   Consent given by:  Patient   Risks discussed:  Infection, need for additional repair, pain, poor cosmetic result and poor wound healing   Alternatives discussed:  No treatment and delayed treatment Universal protocol:    Procedure explained and questions answered to patient or proxy's satisfaction: yes     Relevant documents present and verified: yes     Test results available: yes     Imaging studies available: yes     Required blood products, implants, devices, and special equipment available: yes     Site/side marked: yes     Immediately prior to procedure, a time out was called: yes     Patient identity confirmed:  Verbally with patient Anesthesia:    Anesthesia method:  Local infiltration   Local anesthetic:  Lidocaine 1% WITH epi Laceration details:    Location:  Lip   Lip location:  Upper exterior lip   Length (cm):  3   Depth (mm):  10 Pre-procedure details:    Preparation:  Patient was prepped and draped in usual sterile fashion Exploration:    Wound exploration: wound explored through full range of motion and entire depth of wound visualized     Wound extent comment:  [place] border seperated Treatment:    Area cleansed with:  Povidone-iodine   Amount of cleaning:  Standard   Irrigation solution:  Sterile saline   Irrigation method:  Syringe Skin repair:    Repair method:  Sutures   Suture size:  5-0   Wound skin closure material used: vicryl rapide.   Number of sutures:  4 Approximation:    Approximation:  Close   Vermilion border well-aligned: yes   Repair type:    Repair type:  Simple Post-procedure details:    Dressing:  Open (no dressing)   Procedure completion:  Tolerated well, no immediate complications .Marland KitchenLaceration Repair  Date/Time: 08/08/2021 10:48 PM Performed by: Margarita Mail, PA-C Authorized by: Margarita Mail, PA-C   Consent:    Consent obtained:  Verbal   Consent given by:  Patient   Risks discussed:  Infection, need for additional repair, pain, poor cosmetic result and poor wound healing   Alternatives discussed:  No treatment and delayed treatment Universal protocol:    Procedure explained and questions answered to patient or proxy's satisfaction: yes     Relevant documents present and verified: yes     Test results available: yes     Imaging studies available: yes     Required blood products, implants, devices, and special equipment available: yes     Site/side marked: yes     Immediately prior to procedure, a time out was called: yes     Patient identity confirmed:  Verbally with patient Anesthesia:    Anesthesia method:  Local infiltration   Local anesthetic:  Lidocaine 1% WITH epi Laceration details:    Location:  Face   Face location:  Chin   Length (cm):  1   Depth (mm):  2 Pre-procedure details:    Preparation:  Patient was prepped and draped in usual sterile fashion Exploration:    Wound exploration: wound explored through full range of motion and entire depth of wound visualized   Treatment:    Area cleansed with:  Povidone-iodine   Amount of cleaning:  Standard   Irrigation solution:  Sterile  saline   Irrigation method:  Syringe Skin repair:    Repair method:  Sutures   Suture size:  5-0   Wound skin closure material used: vicryl rapide.   Suture technique:  Simple interrupted   Number of sutures:  1 Approximation:    Approximation:  Close Repair type:    Repair type:  Simple Post-procedure details:    Dressing:  Open (no dressing)   Procedure completion:  Tolerated well, no immediate complications .Marland KitchenLaceration Repair  Date/Time: 08/08/2021 10:49 PM Performed by: Margarita Mail, PA-C Authorized by: Margarita Mail, PA-C   Consent:    Consent obtained:  Verbal   Consent given by:  Patient   Risks discussed:   Infection, need for additional repair, pain, poor cosmetic result and poor wound healing   Alternatives discussed:  No treatment and delayed treatment Universal protocol:    Procedure explained and questions answered to patient or proxy's satisfaction: yes     Relevant documents present and verified: yes     Test results available: yes     Imaging studies available: yes     Required blood products, implants, devices, and special equipment available: yes     Site/side marked: yes     Immediately prior to procedure, a time out was called: yes     Patient identity confirmed:  Verbally with patient Anesthesia:    Anesthesia method:  Local infiltration   Local anesthetic:  Lidocaine 1% WITH epi Laceration details:    Location:  Face   Face location:  R cheek   Length (cm):  2   Depth (mm):  5 Exploration:    Wound exploration: wound explored through full range of motion and entire depth of wound visualized   Treatment:    Area cleansed with:  Povidone-iodine   Amount of cleaning:  Standard   Irrigation solution:  Sterile saline   Irrigation method:  Syringe   Debridement:  None   Undermining:  None Skin repair:    Repair method:  Sutures   Suture size:  5-0   Wound skin closure material used: vicryl rapide.   Suture technique:  Simple interrupted   Number of sutures:  2 Approximation:    Approximation:  Close Repair type:    Repair type:  Simple Post-procedure details:    Dressing:  Open (no dressing)   Procedure completion:  Tolerated well, no immediate complications .Marland KitchenLaceration Repair  Date/Time: 08/08/2021 10:56 PM Performed by: Margarita Mail, PA-C Authorized by: Margarita Mail, PA-C   Consent:    Consent obtained:  Verbal   Consent given by:  Patient   Risks discussed:  Infection, need for additional repair, pain, poor cosmetic result and poor wound healing   Alternatives discussed:  No treatment and delayed treatment Universal protocol:    Procedure explained  and questions answered to patient or proxy's satisfaction: yes     Relevant documents present and verified: yes     Test results available: yes     Imaging studies available: yes     Required blood products, implants, devices, and special equipment available: yes     Site/side marked: yes     Immediately prior to procedure, a time out was called: yes     Patient identity confirmed:  Verbally with patient Anesthesia:    Anesthesia method:  Local infiltration   Local anesthetic:  Lidocaine 1% WITH epi Laceration details:    Location:  Face   Facial location: left temple region.   Length (cm):  1   Depth (mm):  2 Pre-procedure details:    Preparation:  Patient was prepped and draped in usual sterile fashion Exploration:    Wound exploration: wound explored through full range of motion and entire depth of wound visualized   Treatment:    Area cleansed with:  Povidone-iodine   Amount of cleaning:  Standard   Irrigation solution:  Sterile saline   Irrigation method:  Pressure wash   Visualized foreign bodies/material removed: no   Skin repair:    Repair method:  Sutures   Suture size:  5-0   Wound skin closure material used: vicryl rapide.   Suture technique:  Simple interrupted   Number of sutures:  1 Approximation:    Approximation:  Close Repair type:    Repair type:  Simple Post-procedure details:    Dressing:  Open (no dressing)   Procedure completion:  Tolerated well, no immediate complications .Marland KitchenLaceration Repair  Date/Time: 08/08/2021 11:01 PM Performed by: Margarita Mail, PA-C Authorized by: Margarita Mail, PA-C   Consent:    Consent obtained:  Verbal   Consent given by:  Patient   Risks discussed:  Infection, need for additional repair, pain, poor cosmetic result and poor wound healing   Alternatives discussed:  No treatment and delayed treatment Universal protocol:    Procedure explained and questions answered to patient or proxy's satisfaction: yes      Relevant documents present and verified: yes     Test results available: yes     Imaging studies available: yes     Required blood products, implants, devices, and special equipment available: yes     Site/side marked: yes     Immediately prior to procedure, a time out was called: yes     Patient identity confirmed:  Verbally with patient Anesthesia:    Anesthesia method:  Local infiltration   Local anesthetic:  Lidocaine 1% w/o epi Laceration details:    Location:  Shoulder/arm   Shoulder/arm location:  L upper arm   Length (cm):  4   Depth (mm):  20 Pre-procedure details:    Preparation:  Patient was prepped and draped in usual sterile fashion Exploration:    Wound exploration: wound explored through full range of motion and entire depth of wound visualized   Treatment:    Area cleansed with:  Povidone-iodine   Amount of cleaning:  Standard   Irrigation method:  Syringe Skin repair:    Repair method:  Sutures   Suture size:  4-0   Wound skin closure material used: vicryl rapide.   Suture technique:  Simple interrupted   Number of sutures:  3 Approximation:    Approximation:  Close Repair type:    Repair type:  Simple Post-procedure details:    Dressing:  Open (no dressing)   Procedure completion:  Tolerated well, no immediate complications .Marland KitchenLaceration Repair  Date/Time: 08/08/2021 11:04 PM Performed by: Margarita Mail, PA-C Authorized by: Margarita Mail, PA-C   Consent:    Consent obtained:  Verbal   Consent given by:  Patient   Risks discussed:  Infection, need for additional repair, pain, poor cosmetic result and poor wound healing   Alternatives discussed:  No treatment and delayed treatment Universal protocol:    Procedure explained and questions answered to patient or proxy's satisfaction: yes     Relevant documents present and verified: yes     Test results available: yes     Imaging studies available: yes     Required blood products, implants, devices,  and special equipment available: yes     Site/side marked: yes     Immediately prior to procedure, a time out was called: yes     Patient identity confirmed:  Verbally with patient Anesthesia:    Anesthesia method:  Local infiltration   Local anesthetic:  Lidocaine 1% WITH epi Laceration details:    Location:  Shoulder/arm   Shoulder/arm location:  L upper arm   Length (cm):  3   Depth (mm):  10 Pre-procedure details:    Preparation:  Patient was prepped and draped in usual sterile fashion Treatment:    Area cleansed with:  Povidone-iodine   Amount of cleaning:  Standard   Irrigation solution:  Sterile saline   Irrigation method:  Syringe Skin repair:    Repair method:  Sutures   Suture size:  4-0 (vicryl rapide)   Suture technique:  Simple interrupted and horizontal mattress   Number of sutures:  2 Approximation:    Approximation:  Close Repair type:    Repair type:  Simple Post-procedure details:    Dressing:  Open (no dressing)   Procedure completion:  Tolerated well, no immediate complications .Marland KitchenLaceration Repair  Date/Time: 08/08/2021 11:09 PM Performed by: Margarita Mail, PA-C Authorized by: Margarita Mail, PA-C   Consent:    Consent obtained:  Verbal   Consent given by:  Patient   Risks discussed:  Infection, need for additional repair, pain, poor cosmetic result and poor wound healing   Alternatives discussed:  No treatment and delayed treatment Universal protocol:    Procedure explained and questions answered to patient or proxy's satisfaction: yes     Relevant documents present and verified: yes     Test results available: yes     Imaging studies available: yes     Required blood products, implants, devices, and special equipment available: yes     Site/side marked: yes     Immediately prior to procedure, a time out was called: yes     Patient identity confirmed:  Verbally with patient Anesthesia:    Anesthesia method:  Local infiltration   Local  anesthetic:  Lidocaine 1% WITH epi Laceration details:    Location:  Shoulder/arm   Shoulder/arm location:  R upper arm   Length (cm):  4   Depth (mm):  30 Pre-procedure details:    Preparation:  Patient was prepped and draped in usual sterile fashion Exploration:    Wound exploration: wound explored through full range of motion and entire depth of wound visualized   Treatment:    Area cleansed with:  Povidone-iodine   Amount of cleaning:  Standard   Irrigation solution:  Sterile saline   Irrigation method:  Syringe Skin repair:    Repair method:  Sutures   Suture size:  5-0   Wound skin closure material used: vicryl rapide.   Suture technique:  Running locked   Number of sutures:  7 Approximation:    Approximation:  Close Repair type:    Repair type:  Simple Post-procedure details:    Procedure completion:  Tolerated well, no immediate complications .Marland KitchenLaceration Repair  Date/Time: 08/08/2021 11:11 PM Performed by: Margarita Mail, PA-C Authorized by: Margarita Mail, PA-C   Consent:    Consent obtained:  Verbal   Consent given by:  Patient   Risks discussed:  Infection, need for additional repair, pain, poor cosmetic result and poor wound healing   Alternatives discussed:  No treatment and delayed treatment Universal protocol:    Procedure explained and questions answered  to patient or proxy's satisfaction: yes     Relevant documents present and verified: yes     Test results available: yes     Imaging studies available: yes     Required blood products, implants, devices, and special equipment available: yes     Site/side marked: yes     Immediately prior to procedure, a time out was called: yes     Patient identity confirmed:  Verbally with patient Anesthesia:    Anesthesia method:  Local infiltration   Local anesthetic:  Lidocaine 1% WITH epi Laceration details:    Location:  Shoulder/arm   Shoulder/arm location:  R upper arm   Length (cm):  3   Depth (mm):   10 Pre-procedure details:    Preparation:  Patient was prepped and draped in usual sterile fashion Exploration:    Wound exploration: wound explored through full range of motion and entire depth of wound visualized   Treatment:    Area cleansed with:  Povidone-iodine   Irrigation solution:  Sterile saline   Irrigation method:  Syringe Skin repair:    Repair method:  Sutures   Suture size:  4-0   Wound skin closure material used: vicryl rapide.   Suture technique:  Simple interrupted   Number of sutures:  2 Approximation:    Approximation:  Close Repair type:    Repair type:  Simple Post-procedure details:    Dressing:  Open (no dressing)   Procedure completion:  Tolerated well, no immediate complications    Margarita Mail, PA-C 08/08/21 [place], [place], DO 08/09/21 1929

## 2021-08-08 NOTE — Progress Notes (Signed)
Orthopedic Tech Progress Note Patient Details:  Gaelyn Tukes 10/27/1980 [id number]  Level 1 trauma   Patient ID: Willey Blade, female   DOB: [birth year], 40 y.o.   MRN: [id number]  Janit Pagan 08/08/2021, 5:14 PM

## 2021-08-09 ENCOUNTER — Encounter: Payer: Self-pay | Admitting: Physician Assistant

## 2021-12-12 ENCOUNTER — Ambulatory Visit: Payer: BLUE CROSS/BLUE SHIELD | Admitting: Obstetrics and Gynecology

## 2022-03-21 ENCOUNTER — Encounter: Payer: Self-pay | Admitting: Emergency Medicine

## 2022-03-21 ENCOUNTER — Emergency Department
Admission: EM | Admit: 2022-03-21 | Discharge: 2022-03-21 | Disposition: A | Discharge status: Home / Self Care | Payer: BLUE CROSS/BLUE SHIELD | Attending: Emergency Medicine | Admitting: Emergency Medicine

## 2022-03-21 DIAGNOSIS — N3001 Acute cystitis with hematuria: Secondary | ICD-10-CM | POA: Diagnosis not present

## 2022-03-21 DIAGNOSIS — R059 Cough, unspecified: Secondary | ICD-10-CM | POA: Insufficient documentation

## 2022-03-21 DIAGNOSIS — R0981 Nasal congestion: Secondary | ICD-10-CM | POA: Insufficient documentation

## 2022-03-21 DIAGNOSIS — Z20822 Contact with and (suspected) exposure to covid-19: Secondary | ICD-10-CM | POA: Diagnosis not present

## 2022-03-21 DIAGNOSIS — R1110 Vomiting, unspecified: Secondary | ICD-10-CM | POA: Diagnosis not present

## 2022-03-21 DIAGNOSIS — M791 Myalgia, unspecified site: Secondary | ICD-10-CM | POA: Insufficient documentation

## 2022-03-21 LAB — URINALYSIS, ROUTINE W REFLEX MICROSCOPIC
Glucose, UA: NEGATIVE mg/dL
Ketones, ur: 5 mg/dL — AB
Nitrite: NEGATIVE
Protein, ur: 100 mg/dL — AB
Specific Gravity, Urine: 1.028 (ref 1.005–1.030)
WBC, UA: >50 WBC/hpf — ABNORMAL HIGH (ref 0–5)
pH: 5 (ref 5.0–8.0)

## 2022-03-21 LAB — CBC WITH DIFFERENTIAL/PLATELET
Abs Immature Granulocytes: 0.03 10*3/uL (ref 0.00–0.07)
Basophils Absolute: 0.1 10*3/uL (ref 0.0–0.1)
Basophils Relative: 1 %
Eosinophils Absolute: 0.1 10*3/uL (ref 0.0–0.5)
Eosinophils Relative: 2 %
HCT: 45.7 % (ref 36.0–46.0)
Hemoglobin: 15.7 g/dL — ABNORMAL HIGH (ref 12.0–15.0)
Immature Granulocytes: 1 %
Lymphocytes Relative: 29 %
Lymphs Abs: 1.2 10*3/uL (ref 0.7–4.0)
MCH: 30.7 pg (ref 26.0–34.0)
MCHC: 34.4 g/dL (ref 30.0–36.0)
MCV: 89.3 fL (ref 80.0–100.0)
Monocytes Absolute: 1 10*3/uL (ref 0.1–1.0)
Monocytes Relative: 25 %
Neutro Abs: 1.8 10*3/uL (ref 1.7–7.7)
Neutrophils Relative %: 42 %
Platelets: 93 10*3/uL — ABNORMAL LOW (ref 150–400)
RBC: 5.12 MIL/uL — ABNORMAL HIGH (ref 3.87–5.11)
RDW: 13.3 % (ref 11.5–15.5)
WBC: 4.2 10*3/uL (ref 4.0–10.5)
nRBC: 0 % (ref 0.0–0.2)

## 2022-03-21 LAB — BASIC METABOLIC PANEL
Anion gap: 9 (ref 5–15)
BUN: 7 mg/dL (ref 6–20)
CO2: 22 mmol/L (ref 22–32)
Calcium: 8.2 mg/dL — ABNORMAL LOW (ref 8.9–10.3)
Chloride: 101 mmol/L (ref 98–111)
Creatinine, Ser: 0.46 mg/dL (ref 0.44–1.00)
GFR, Estimated: >60 mL/min (ref >60)
Glucose, Bld: 106 mg/dL — ABNORMAL HIGH (ref 70–99)
Potassium: 3.4 mmol/L — ABNORMAL LOW (ref 3.5–5.1)
Sodium: 132 mmol/L — ABNORMAL LOW (ref 135–145)

## 2022-03-21 LAB — SARS CORONAVIRUS 2 BY RT PCR: SARS Coronavirus 2 by RT PCR: NEGATIVE

## 2022-03-21 LAB — PREGNANCY, URINE: Preg Test, Ur: NEGATIVE

## 2022-03-21 MED ORDER — SODIUM CHLORIDE 0.9 % IV BOLUS
1000.0000 mL | Freq: Once | INTRAVENOUS | Status: AC
  Administered 2022-03-21: 1000 mL via INTRAVENOUS

## 2022-03-21 MED ORDER — ONDANSETRON HCL 4 MG/2ML IJ SOLN
4.0000 mg | Freq: Once | INTRAMUSCULAR | Status: AC
  Administered 2022-03-21: 4 mg via INTRAVENOUS
  Filled 2022-03-21: qty 2

## 2022-03-21 MED ORDER — CEFDINIR 300 MG PO CAPS: 300.0000 mg | ORAL_CAPSULE | Freq: Two times a day (BID) | ORAL | 0 refills | Status: AC

## 2022-03-21 MED ORDER — SODIUM CHLORIDE 0.9 % IV SOLN
1.0000 g | INTRAVENOUS | Status: DC
  Administered 2022-03-21: 1 g via INTRAVENOUS
  Filled 2022-03-21: qty 10

## 2022-03-21 NOTE — ED Provider Notes (Signed)
[facility] Provider Note    Event Date/Time   First MD Initiated Contact with Patient 03/21/22 2036     (approximate)   History   Nasal Congestion   HPI  Jamie Bishop is a 41 y.o. female who presents today for evaluation of body aches, back pain, chills, cough, nasal congestion, and vomiting for the past 2 days.  Patient also reports that her urine "looks weird" also for the past 2 days.  She also reports that it smells bad.  She denies flank pain.  She she reports that she feels chills, but has not taken her temperature.  She did not take any medications today.  She reports that she has been trying to stay hydrated with Gatorade.  She denies history of kidney stone.  Denies vaginal discharge or bleeding.  Patient Active Problem List   Diagnosis Date Noted   Uterine synechiae    Hematometra           Physical Exam   Triage Vital Signs: ED Triage Vitals  Enc Vitals Group     BP 03/21/22 1905 (!) 135/92     Pulse Rate 03/21/22 1905 91     Resp 03/21/22 1905 18     Temp 03/21/22 1905 98.3 F (36.8 C)     Temp Source 03/21/22 1905 Oral     SpO2 03/21/22 1905 98 %     Weight 03/21/22 1906 145 lb 8.1 oz (66 kg)     Height 03/21/22 1906 '4\' 11"'$  (1.499 m)     Head Circumference --      Peak Flow --      Pain Score 03/21/22 1906 10     Pain Loc --      Pain Edu? --      Excl. in [place]? --     Most recent vital signs: Vitals:   03/21/22 1905 03/21/22 2307  BP: (!) 135/92 129/88  Pulse: 91 87  Resp: 18 15  Temp: 98.3 F (36.8 C) 99.6 F (37.6 C)  SpO2: 98% 99%    Physical Exam Vitals and nursing note reviewed.  Constitutional:      General: Awake and alert. No acute distress.    Appearance: Normal appearance. She is well-developed and normal weight.  HENT:     Head: Normocephalic and atraumatic.     Mouth/Throat: Uvula midline.  No tonsillar exudate.  No soft palate fluctuance.  No trismus.  No voice change.  No sublingual  swelling.  No tender cervical lymphadenopathy.  No nuchal rigidity    Mouth: Mucous membranes are dry Eyes:     General: PERRL. Normal EOMs        Right eye: No discharge.        Left eye: No discharge.     Conjunctiva/sclera: Conjunctivae normal.  Cardiovascular:     Rate and Rhythm: Normal rate and regular rhythm.     Pulses: Normal pulses.     Heart sounds: Normal heart sounds Pulmonary:     Effort: Pulmonary effort is normal. No respiratory distress.     Breath sounds: Normal breath sounds.  Abdominal:     Abdomen is soft. There is no abdominal tenderness. No rebound or guarding. No distention.  No CVA tenderness Back: No midline tenderness. Strength and sensation 5/5 to bilateral lower extremities. Normal great toe extension against resistance. Normal sensation throughout feet. Normal patellar reflexes. Negative SLR and opposite SLR bilaterally. Musculoskeletal:        General:  No swelling. Normal range of motion.     Cervical back: Normal range of motion and neck supple.  Lymphadenopathy:     Cervical: No cervical adenopathy.  Skin:    General: Skin is warm and dry.     Capillary Refill: Capillary refill takes less than 2 seconds.     Findings: No rash.  Neurological:     Mental Status: She is alert.      ED Results / Procedures / Treatments   Labs (all labs ordered are listed, but only abnormal results are displayed) Labs Reviewed  CBC WITH DIFFERENTIAL/PLATELET - Abnormal; Notable for the following components:      Result Value   RBC 5.12 (*)    Hemoglobin 15.7 (*)    Platelets 93 (*)    All other components within normal limits  BASIC METABOLIC PANEL - Abnormal; Notable for the following components:   Sodium 132 (*)    Potassium 3.4 (*)    Glucose, Bld 106 (*)    Calcium 8.2 (*)    All other components within normal limits  URINALYSIS, ROUTINE W REFLEX MICROSCOPIC - Abnormal; Notable for the following components:   Color, Urine AMBER (*)    APPearance HAZY  (*)    Hgb urine dipstick SMALL (*)    Bilirubin Urine MODERATE (*)    Ketones, ur 5 (*)    Protein, ur 100 (*)    Leukocytes,Ua SMALL (*)    WBC, UA >50 (*)    Bacteria, UA FEW (*)    All other components within normal limits  SARS CORONAVIRUS 2 BY RT PCR  PREGNANCY, URINE     EKG     RADIOLOGY     PROCEDURES:  Critical Care performed:   Procedures   MEDICATIONS ORDERED IN ED: Medications  cefTRIAXone (ROCEPHIN) 1 g in sodium chloride 0.9 % 100 mL IVPB (0 g Intravenous Stopped 03/21/22 2202)  sodium chloride 0.9 % bolus 1,000 mL (0 mLs Intravenous Stopped 03/21/22 2231)  ondansetron (ZOFRAN) injection 4 mg (4 mg Intravenous Given 03/21/22 2132)     IMPRESSION / MDM / ASSESSMENT AND PLAN / ED COURSE  I reviewed the triage vital signs and the nursing notes.   Differential diagnosis includes, but is not limited to, URI, UTI/pyelo-, COVID-19, influenza.  Patient is awake and alert, hemodynamically stable and afebrile.  She is nontoxic in appearance.  Lungs are clear to auscultation bilaterally, no increased work of breathing, normal oxygen saturation on room air with no fever without antipyretics, do not suspect pneumonia.  COVID test obtained at triage is negative.  Given patient's additional complaints, labs, UA obtained.  Urinalysis appears to be infected, and per her history of foul-smelling urine and low back pain, UTI/pyelonephritis is quite possible.  She was given a liter of normal saline given her dry mucous membranes, as well as ceftriaxone for possible pyelonephritis.  She was also started on cefdinir.  No flank pain or history of stone to suggest kidney stone.  Upon reevaluation, patient reports that she feels significantly improved.  She is able to tolerate p.o. without difficulty.  We discussed strict return precautions and the importance of very close outpatient follow-up.  Patient understands and agrees with plan.  Discharged in stable condition.   Patient's  presentation is most consistent with acute complicated illness / injury requiring diagnostic workup.    Clinical Course as of 03/21/22 2322  Wed Mar 21, 2022  2232 Patient reports she feels significantly improved [JP]  Clinical Course User Index [JP] Star Cheese, Clarnce Flock, PA-C     FINAL CLINICAL IMPRESSION(S) / ED DIAGNOSES   Final diagnoses:  Acute cystitis with hematuria     Rx / DC Orders   ED Discharge Orders          Ordered    cefdinir (OMNICEF) 300 MG capsule  2 times daily        03/21/22 2241             Note:  This document was prepared using Dragon voice recognition software and may include unintentional dictation errors.   Emeline Gins 03/21/22 2322    Vanessa [place], MD 03/22/22 1147

## 2022-03-21 NOTE — ED Triage Notes (Signed)
Pt presents via POV with complaints of night sweats, nasal congestion, and intermittent fevers for the last 3 days. She has been taking OTC tylenol and consuming electrolyte beverages. Denies CP or SOB.

## 2022-03-21 NOTE — Discharge Instructions (Signed)
Take the antibiotics as prescribed.  Please return the emergency department immediately for any new, worsening, or change in symptoms or other concerns including abdominal pain, fevers, chills, or any other concerns.

## 2022-03-21 NOTE — ED Notes (Signed)
FIRST RN- Flu like sx's since Monday.

## 2022-03-27 ENCOUNTER — Telehealth: Payer: Self-pay | Admitting: Emergency Medicine

## 2022-03-27 NOTE — Telephone Encounter (Signed)
Jamie Bishop called to tell us that she was having decreased appetite and nausea and that it could be from the antibiotic.  I asked how she was doing.  She says she still feels bad and only occasionally has a couple hours where she feels she has more energy.  Says she does continue to run fever at times.  Says still has pain in her back. She has been on antibiotic for 6 days now.   I advised her to be seen again in follow up.  She does not have a pcp right now.  I told her about [facility] and [facility] urgent cares.  She agrees to follow up.

## 2022-07-04 ENCOUNTER — Ambulatory Visit: Payer: BLUE CROSS/BLUE SHIELD | Admitting: Obstetrics & Gynecology

## 2022-08-12 ENCOUNTER — Other Ambulatory Visit: Payer: Self-pay

## 2022-08-12 ENCOUNTER — Encounter: Payer: Self-pay | Admitting: Emergency Medicine

## 2022-08-12 ENCOUNTER — Emergency Department
Admission: EM | Admit: 2022-08-12 | Discharge: 2022-08-12 | Disposition: A | Discharge status: Home / Self Care | Payer: BLUE CROSS/BLUE SHIELD | Attending: Emergency Medicine | Admitting: Emergency Medicine

## 2022-08-12 ENCOUNTER — Emergency Department: Payer: BLUE CROSS/BLUE SHIELD

## 2022-08-12 DIAGNOSIS — S59911A Unspecified injury of right forearm, initial encounter: Secondary | ICD-10-CM | POA: Diagnosis present

## 2022-08-12 DIAGNOSIS — Y9359 Activity, other involving other sports and athletics played individually: Secondary | ICD-10-CM | POA: Insufficient documentation

## 2022-08-12 DIAGNOSIS — S51811A Laceration without foreign body of right forearm, initial encounter: Secondary | ICD-10-CM | POA: Diagnosis not present

## 2022-08-12 DIAGNOSIS — W19XXXA Unspecified fall, initial encounter: Secondary | ICD-10-CM

## 2022-08-12 DIAGNOSIS — Y92481 Parking lot as the place of occurrence of the external cause: Secondary | ICD-10-CM | POA: Insufficient documentation

## 2022-08-12 DIAGNOSIS — W25XXXA Contact with sharp glass, initial encounter: Secondary | ICD-10-CM | POA: Insufficient documentation

## 2022-08-12 MED ORDER — CEPHALEXIN 500 MG PO CAPS: 500.0000 mg | ORAL_CAPSULE | Freq: Four times a day (QID) | ORAL | 0 refills | Status: AC

## 2022-08-12 MED ORDER — ONDANSETRON 4 MG PO TBDP
4.0000 mg | ORAL_TABLET | Freq: Once | ORAL | Status: AC
  Administered 2022-08-12: 4 mg via ORAL
  Filled 2022-08-12: qty 1

## 2022-08-12 MED ORDER — ACETAMINOPHEN 325 MG PO TABS
650.0000 mg | ORAL_TABLET | Freq: Once | ORAL | Status: AC
Start: 2022-08-12 — End: 2022-08-12
  Administered 2022-08-12: 650 mg via ORAL
  Filled 2022-08-12: qty 2

## 2022-08-12 NOTE — ED Triage Notes (Signed)
Pt reports fell in a parking lot last pm and there was glass on the ground and pt hurt her right forearm and has some cuts. Denies LOC or head injuries.
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
## 2022-08-12 NOTE — Discharge Instructions (Signed)
You can alternate Tylenol and ibuprofen for pain. Keep wound dressed with derma clips in place until they fall off on their own. Take Keflex 4 times daily for the next 7 days. If you notice any redness or streaking surrounding the wound site, please return for reevaluation.

## 2022-08-27 ENCOUNTER — Ambulatory Visit: Payer: BLUE CROSS/BLUE SHIELD | Admitting: Obstetrics & Gynecology

## 2022-09-01 ENCOUNTER — Emergency Department: Payer: BLUE CROSS/BLUE SHIELD

## 2022-09-01 ENCOUNTER — Other Ambulatory Visit: Payer: Self-pay

## 2022-09-01 ENCOUNTER — Emergency Department
Admission: EM | Admit: 2022-09-01 | Discharge: 2022-09-01 | Disposition: A | Discharge status: Home / Self Care | Payer: BLUE CROSS/BLUE SHIELD | Attending: Emergency Medicine | Admitting: Emergency Medicine

## 2022-09-01 DIAGNOSIS — J45909 Unspecified asthma, uncomplicated: Secondary | ICD-10-CM | POA: Insufficient documentation

## 2022-09-01 DIAGNOSIS — S0990XA Unspecified injury of head, initial encounter: Secondary | ICD-10-CM | POA: Diagnosis present

## 2022-09-01 MED ORDER — IBUPROFEN 800 MG PO TABS
800.0000 mg | ORAL_TABLET | Freq: Once | ORAL | Status: AC
Start: 2022-09-01 — End: 2022-09-01
  Administered 2022-09-01: 800 mg via ORAL
  Filled 2022-09-01: qty 1

## 2022-09-01 NOTE — Discharge Instructions (Signed)
You may alternate Tylenol 1000 mg every 6 hours as needed for pain, fever and Ibuprofen 800 mg every 6-8 hours as needed for pain, fever.  Please take Ibuprofen with food.  Do not take more than 4000 mg of Tylenol (acetaminophen) in a 24 hour period.  Steps to find a Primary Care Provider (PCP):  Call [phone] or [phone] to access "[facility]."  2.  You may also go on the [facility] website at [email or web address]
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
## 2022-09-01 NOTE — ED Notes (Signed)
Dc instructions given verbally and in writing, understanding voiced, signature obtained. PT left in stable condition to lobby after DC.

## 2022-09-01 NOTE — ED Notes (Signed)
Patient transported to CT 
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
## 2022-09-24 ENCOUNTER — Ambulatory Visit: Payer: BLUE CROSS/BLUE SHIELD | Admitting: Obstetrics & Gynecology
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
## 2022-11-13 ENCOUNTER — Ambulatory Visit: Payer: BLUE CROSS/BLUE SHIELD | Admitting: Obstetrics and Gynecology

## 2022-11-13 DIAGNOSIS — Z01419 Encounter for gynecological examination (general) (routine) without abnormal findings: Secondary | ICD-10-CM

## 2022-11-13 DIAGNOSIS — Z1231 Encounter for screening mammogram for malignant neoplasm of breast: Secondary | ICD-10-CM
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
